# Patient Record
Sex: Female | Born: 1950 | Race: White | Hispanic: No | Marital: Single | Smoking: Current some day smoker
Health system: Southern US, Community
[De-identification: ages and names within clinical notes are randomized; demographics above are authoritative.]

## PROBLEM LIST (undated history)

## (undated) DIAGNOSIS — F32A Depression, unspecified: Secondary | ICD-10-CM

## (undated) DIAGNOSIS — M1990 Unspecified osteoarthritis, unspecified site: Secondary | ICD-10-CM

## (undated) DIAGNOSIS — F419 Anxiety disorder, unspecified: Secondary | ICD-10-CM

## (undated) DIAGNOSIS — J449 Chronic obstructive pulmonary disease, unspecified: Secondary | ICD-10-CM

## (undated) HISTORY — PX: HYSTEROSCOPY: SHX211

---

## 2004-02-05 ENCOUNTER — Emergency Department (HOSPITAL_COMMUNITY): Admission: EM | Admit: 2004-02-05 | Discharge: 2004-02-05 | Payer: Self-pay | Admitting: Emergency Medicine

## 2004-10-31 ENCOUNTER — Ambulatory Visit (HOSPITAL_COMMUNITY): Admission: RE | Admit: 2004-10-31 | Discharge: 2004-10-31 | Payer: Self-pay | Admitting: Family Medicine

## 2006-10-13 ENCOUNTER — Ambulatory Visit (HOSPITAL_COMMUNITY): Admission: RE | Admit: 2006-10-13 | Discharge: 2006-10-13 | Payer: Self-pay | Admitting: Family Medicine

## 2006-10-13 ENCOUNTER — Ambulatory Visit: Payer: Self-pay | Admitting: Orthopedic Surgery

## 2006-10-15 ENCOUNTER — Ambulatory Visit (HOSPITAL_COMMUNITY): Admission: RE | Admit: 2006-10-15 | Discharge: 2006-10-15 | Payer: Self-pay | Admitting: Orthopedic Surgery

## 2006-10-21 ENCOUNTER — Ambulatory Visit: Payer: Self-pay | Admitting: Orthopedic Surgery

## 2006-10-23 ENCOUNTER — Encounter (HOSPITAL_COMMUNITY): Admission: RE | Admit: 2006-10-23 | Discharge: 2006-11-22 | Payer: Self-pay | Admitting: Orthopedic Surgery

## 2006-11-25 ENCOUNTER — Encounter (HOSPITAL_COMMUNITY): Admission: RE | Admit: 2006-11-25 | Discharge: 2006-12-25 | Payer: Self-pay | Admitting: Orthopedic Surgery

## 2006-12-03 ENCOUNTER — Ambulatory Visit: Payer: Self-pay | Admitting: Orthopedic Surgery

## 2006-12-29 ENCOUNTER — Encounter (HOSPITAL_COMMUNITY): Admission: RE | Admit: 2006-12-29 | Discharge: 2007-01-28 | Payer: Self-pay | Admitting: Orthopedic Surgery

## 2006-12-31 ENCOUNTER — Ambulatory Visit: Payer: Self-pay | Admitting: Orthopedic Surgery

## 2010-02-13 ENCOUNTER — Ambulatory Visit (HOSPITAL_COMMUNITY): Admission: RE | Admit: 2010-02-13 | Discharge: 2010-02-13 | Payer: Self-pay | Admitting: Family Medicine

## 2010-04-06 ENCOUNTER — Encounter (HOSPITAL_COMMUNITY): Admission: RE | Admit: 2010-04-06 | Discharge: 2010-05-06 | Payer: Self-pay | Admitting: Preventative Medicine

## 2015-06-18 ENCOUNTER — Encounter (HOSPITAL_COMMUNITY): Payer: Self-pay

## 2015-06-18 ENCOUNTER — Inpatient Hospital Stay (HOSPITAL_COMMUNITY)
Admission: EM | Admit: 2015-06-18 | Discharge: 2015-06-23 | DRG: 190 | Disposition: A | Discharge status: Home / Self Care | Payer: 59 | Attending: Internal Medicine | Admitting: Internal Medicine

## 2015-06-18 ENCOUNTER — Emergency Department (HOSPITAL_COMMUNITY): Payer: 59

## 2015-06-18 DIAGNOSIS — T50905A Adverse effect of unspecified drugs, medicaments and biological substances, initial encounter: Secondary | ICD-10-CM

## 2015-06-18 DIAGNOSIS — F419 Anxiety disorder, unspecified: Secondary | ICD-10-CM | POA: Diagnosis present

## 2015-06-18 DIAGNOSIS — Z720 Tobacco use: Secondary | ICD-10-CM | POA: Diagnosis present

## 2015-06-18 DIAGNOSIS — F17210 Nicotine dependence, cigarettes, uncomplicated: Secondary | ICD-10-CM | POA: Diagnosis present

## 2015-06-18 DIAGNOSIS — M549 Dorsalgia, unspecified: Secondary | ICD-10-CM | POA: Diagnosis present

## 2015-06-18 DIAGNOSIS — J9601 Acute respiratory failure with hypoxia: Secondary | ICD-10-CM | POA: Diagnosis present

## 2015-06-18 DIAGNOSIS — M545 Low back pain: Secondary | ICD-10-CM | POA: Diagnosis present

## 2015-06-18 DIAGNOSIS — J441 Chronic obstructive pulmonary disease with (acute) exacerbation: Principal | ICD-10-CM | POA: Diagnosis present

## 2015-06-18 DIAGNOSIS — R946 Abnormal results of thyroid function studies: Secondary | ICD-10-CM | POA: Diagnosis present

## 2015-06-18 DIAGNOSIS — T380X5A Adverse effect of glucocorticoids and synthetic analogues, initial encounter: Secondary | ICD-10-CM | POA: Diagnosis present

## 2015-06-18 DIAGNOSIS — G8929 Other chronic pain: Secondary | ICD-10-CM | POA: Diagnosis present

## 2015-06-18 DIAGNOSIS — R739 Hyperglycemia, unspecified: Secondary | ICD-10-CM | POA: Diagnosis present

## 2015-06-18 DIAGNOSIS — R0602 Shortness of breath: Secondary | ICD-10-CM | POA: Diagnosis present

## 2015-06-18 LAB — CBC WITH DIFFERENTIAL/PLATELET
BASOS ABS: 0 10*3/uL (ref 0.0–0.1)
BASOS PCT: 0 %
EOS ABS: 0.1 10*3/uL (ref 0.0–0.7)
Eosinophils Relative: 1 %
HEMATOCRIT: 48.7 % — AB (ref 36.0–46.0)
Hemoglobin: 15.8 g/dL — ABNORMAL HIGH (ref 12.0–15.0)
Lymphocytes Relative: 43 %
Lymphs Abs: 4.1 10*3/uL — ABNORMAL HIGH (ref 0.7–4.0)
MCH: 31.9 pg (ref 26.0–34.0)
MCHC: 32.4 g/dL (ref 30.0–36.0)
MCV: 98.2 fL (ref 78.0–100.0)
MONO ABS: 0.7 10*3/uL (ref 0.1–1.0)
Monocytes Relative: 7 %
NEUTROS ABS: 4.7 10*3/uL (ref 1.7–7.7)
Neutrophils Relative %: 49 %
PLATELETS: 199 10*3/uL (ref 150–400)
RBC: 4.96 MIL/uL (ref 3.87–5.11)
RDW: 16 % — AB (ref 11.5–15.5)
WBC: 9.7 10*3/uL (ref 4.0–10.5)

## 2015-06-18 LAB — BASIC METABOLIC PANEL
Anion gap: 8 (ref 5–15)
BUN: 8 mg/dL (ref 6–20)
CO2: 32 mmol/L (ref 22–32)
Calcium: 9 mg/dL (ref 8.9–10.3)
Chloride: 102 mmol/L (ref 101–111)
Creatinine, Ser: 0.74 mg/dL (ref 0.44–1.00)
GFR calc Af Amer: >60 mL/min (ref >60)
GFR calc non Af Amer: >60 mL/min (ref >60)
Glucose, Bld: 92 mg/dL (ref 65–99)
Potassium: 3.9 mmol/L (ref 3.5–5.1)
Sodium: 142 mmol/L (ref 135–145)

## 2015-06-18 LAB — D-DIMER, QUANTITATIVE: D-Dimer, Quant: 0.5 ug/mL-FEU — ABNORMAL HIGH (ref 0.00–0.48)

## 2015-06-18 MED ORDER — SODIUM CHLORIDE 0.9 % IV BOLUS (SEPSIS): 1000.0000 mL | Freq: Once | INTRAVENOUS | Status: DC

## 2015-06-18 MED ORDER — IOHEXOL 350 MG/ML SOLN
100.0000 mL | Freq: Once | INTRAVENOUS | Status: AC | PRN
  Administered 2015-06-18: 100 mL via INTRAVENOUS

## 2015-06-18 MED ORDER — IPRATROPIUM-ALBUTEROL 0.5-2.5 (3) MG/3ML IN SOLN
3.0000 mL | Freq: Once | RESPIRATORY_TRACT | Status: AC
  Administered 2015-06-18: 3 mL via RESPIRATORY_TRACT
  Filled 2015-06-18: qty 3

## 2015-06-18 MED ORDER — PREDNISONE 50 MG PO TABS
60.0000 mg | ORAL_TABLET | Freq: Once | ORAL | Status: AC
  Administered 2015-06-18: 60 mg via ORAL
  Filled 2015-06-18 (×2): qty 1

## 2015-06-18 MED ORDER — AZITHROMYCIN 250 MG PO TABS
500.0000 mg | ORAL_TABLET | Freq: Once | ORAL | Status: AC
  Administered 2015-06-18: 500 mg via ORAL
  Filled 2015-06-18: qty 2

## 2015-06-18 MED ORDER — ALBUTEROL SULFATE HFA 108 (90 BASE) MCG/ACT IN AERS
INHALATION_SPRAY | RESPIRATORY_TRACT | Status: AC
  Administered 2015-06-18: 22:00:00
  Filled 2015-06-18: qty 6.7

## 2015-06-18 NOTE — ED Notes (Signed)
Patient ambulated oxygen level drop down to 78% after walking very short distant. Patient return to room back on O2.

## 2015-06-18 NOTE — ED Notes (Signed)
12 lead EKG completed and given to Dr. Vinnie Level

## 2015-06-18 NOTE — ED Notes (Signed)
Pt ambulated in hall without oxygen. O2 sats dropped to 73% and HR was 103.

## 2015-06-18 NOTE — Progress Notes (Signed)
First exacerbation of COPD from long term smoking, most likely not documented. Chronic cough.

## 2015-06-18 NOTE — ED Notes (Signed)
Patient had SOB times two days, progressively worsening. Cough with thick yellow sputum. O2 sats 78% on arrival, taken straight to ED room 17, 2l/[place] applied sats now 94%. Patient denies any respiratory history.

## 2015-06-18 NOTE — ED Provider Notes (Signed)
CSN: [id number]     Arrival date & time 06/18/15  1912 History   First MD Initiated Contact with Patient 06/18/15 1936     Chief Complaint  Patient presents with  . Shortness of Breath     (Consider location/radiation/quality/duration/timing/severity/associated sxs/prior Treatment) HPI   patient is a 64 year old female presenting with shortness of breath over the last 3-4 days. Patient was initially hypoxic after walking to the triage. At rest she stays at 93% on room air. Patient reports increased cough feelings of sinus congestion no fever. Patient's been taking over-the-counter medications with no help. Patient states after walking a short distance she becomes very short of breath. Patient's not on any estrogens has not had any long car trips or plane trips.  No past medical history on file. Past Surgical History  Procedure Laterality Date  . Hysteroscopy     No family history on file. Social History  Substance Use Topics  . Smoking status: Current Every Day Smoker -- 0.50 packs/day    Types: Cigarettes  . Smokeless tobacco: Not on file  . Alcohol Use: No   OB History    No data available     Review of Systems  Constitutional: Negative for fever, chills, activity change and fatigue.  HENT: Positive for congestion. Negative for drooling.   Eyes: Negative for discharge.  Respiratory: Positive for cough and shortness of breath. Negative for chest tightness.   Cardiovascular: Negative for chest pain, palpitations and leg swelling.  Gastrointestinal: Negative for abdominal distention.  Genitourinary: Negative for dysuria and difficulty urinating.  Musculoskeletal: Negative for joint swelling.  Skin: Negative for rash.  Allergic/Immunologic: Negative for immunocompromised state.  Psychiatric/Behavioral: Negative for agitation.      Allergies  Review of patient's allergies indicates not on file.  Home Medications   Prior to Admission medications   Not on File   BP  154/76 mmHg  Pulse 72  Resp 20  Ht 5' (1.524 m)  Wt 185 lb (83.915 kg)  BMI 36.13 kg/m2 Physical Exam  Constitutional: She is oriented to person, place, and time. She appears well-developed and well-nourished.  HENT:  Head: Normocephalic and atraumatic.  Eyes: Conjunctivae are normal. Right eye exhibits no discharge.  Neck: Neck supple.  Cardiovascular: Normal rate, regular rhythm and normal heart sounds.   No murmur heard. Pulmonary/Chest: She has wheezes. She has no rales.  tachypnic  Abdominal: Soft. She exhibits no distension. There is no tenderness.  Musculoskeletal: Normal range of motion. She exhibits no edema.  Neurological: She is oriented to person, place, and time. No cranial nerve deficit.  Skin: Skin is warm and dry. No rash noted. She is not diaphoretic.  Psychiatric: She has a normal mood and affect. Her behavior is normal.  Nursing note and vitals reviewed.   ED Course  Procedures (including critical care time) Labs Review Labs Reviewed  BASIC METABOLIC PANEL  CBC WITH DIFFERENTIAL/PLATELET    Imaging Review No results found. I have personally reviewed and evaluated these images and lab results as part of my medical decision-making.   EKG Interpretation None      MDM   Final diagnoses:  None    Patient is a very pleasant 64 year old female presenting today with shortness of breath and cough. Patient's had this for 3 days. Patient was hypoxic with ambulation. No fevers. Patient is a heavy smoker. Patient's had increased green sputum. Patient has an end expiratory wheezes. Will get chest x-ray for concern of pneumonia. Otherwise, would consider treatment  for bronchitis. Given the level of hypoxia will likely treat with antibiotics.   11:06 PM We gave patient prednisone and 3 do nebs. Patient still hypoxic to 82 on ambulation. Don't feel I can discharge patient home with such hypoxia. Despite the fact that she has no evidence of infection. I think this  is likely severe bronchitis. Given the level of hypoxia I ordered CT pulmonary embolism. Study was negative.  Courteney Randall An, MD 06/18/15 [phone]

## 2015-06-19 ENCOUNTER — Encounter (HOSPITAL_COMMUNITY): Payer: Self-pay | Admitting: Internal Medicine

## 2015-06-19 DIAGNOSIS — J441 Chronic obstructive pulmonary disease with (acute) exacerbation: Secondary | ICD-10-CM | POA: Insufficient documentation

## 2015-06-19 DIAGNOSIS — Z720 Tobacco use: Secondary | ICD-10-CM | POA: Diagnosis present

## 2015-06-19 DIAGNOSIS — F419 Anxiety disorder, unspecified: Secondary | ICD-10-CM | POA: Diagnosis present

## 2015-06-19 DIAGNOSIS — M549 Dorsalgia, unspecified: Secondary | ICD-10-CM | POA: Diagnosis present

## 2015-06-19 LAB — CBC
HCT: 47.5 % — ABNORMAL HIGH (ref 36.0–46.0)
HEMOGLOBIN: 15.1 g/dL — AB (ref 12.0–15.0)
MCH: 31.3 pg (ref 26.0–34.0)
MCHC: 31.8 g/dL (ref 30.0–36.0)
MCV: 98.5 fL (ref 78.0–100.0)
PLATELETS: 202 10*3/uL (ref 150–400)
RBC: 4.82 MIL/uL (ref 3.87–5.11)
RDW: 15.8 % — ABNORMAL HIGH (ref 11.5–15.5)
WBC: 7.9 10*3/uL (ref 4.0–10.5)

## 2015-06-19 LAB — BASIC METABOLIC PANEL
ANION GAP: 9 (ref 5–15)
BUN: 8 mg/dL (ref 6–20)
CALCIUM: 8.5 mg/dL — AB (ref 8.9–10.3)
CO2: 27 mmol/L (ref 22–32)
CREATININE: 0.82 mg/dL (ref 0.44–1.00)
Chloride: 99 mmol/L — ABNORMAL LOW (ref 101–111)
GLUCOSE: 223 mg/dL — AB (ref 65–99)
Potassium: 3.8 mmol/L (ref 3.5–5.1)
Sodium: 135 mmol/L (ref 135–145)

## 2015-06-19 LAB — TSH: TSH: 0.285 u[IU]/mL — AB (ref 0.350–4.500)

## 2015-06-19 MED ORDER — ALPRAZOLAM 1 MG PO TABS
1.0000 mg | ORAL_TABLET | Freq: Every day | ORAL | Status: DC
  Administered 2015-06-20 – 2015-06-22 (×3): 1 mg via ORAL
  Filled 2015-06-19 (×3): qty 1

## 2015-06-19 MED ORDER — ALBUTEROL SULFATE (2.5 MG/3ML) 0.083% IN NEBU: 2.5000 mg | INHALATION_SOLUTION | RESPIRATORY_TRACT | Status: DC | PRN

## 2015-06-19 MED ORDER — ENOXAPARIN SODIUM 40 MG/0.4ML ~~LOC~~ SOLN
40.0000 mg | SUBCUTANEOUS | Status: DC
  Administered 2015-06-19 – 2015-06-22 (×5): 40 mg via SUBCUTANEOUS
  Filled 2015-06-19 (×5): qty 0.4

## 2015-06-19 MED ORDER — LEVOFLOXACIN IN D5W 750 MG/150ML IV SOLN
750.0000 mg | INTRAVENOUS | Status: DC
Start: 2015-06-19 — End: 2015-06-22
  Administered 2015-06-19 – 2015-06-21 (×4): 750 mg via INTRAVENOUS
  Filled 2015-06-19 (×4): qty 150

## 2015-06-19 MED ORDER — ALBUTEROL SULFATE (2.5 MG/3ML) 0.083% IN NEBU
2.5000 mg | INHALATION_SOLUTION | Freq: Four times a day (QID) | RESPIRATORY_TRACT | Status: DC
Start: 2015-06-19 — End: 2015-06-19
  Administered 2015-06-19 (×3): 2.5 mg via RESPIRATORY_TRACT
  Filled 2015-06-19 (×3): qty 3

## 2015-06-19 MED ORDER — IPRATROPIUM-ALBUTEROL 0.5-2.5 (3) MG/3ML IN SOLN
3.0000 mL | Freq: Three times a day (TID) | RESPIRATORY_TRACT | Status: DC
  Administered 2015-06-20 – 2015-06-22 (×8): 3 mL via RESPIRATORY_TRACT
  Filled 2015-06-19 (×8): qty 3

## 2015-06-19 MED ORDER — FAMOTIDINE 20 MG PO TABS
20.0000 mg | ORAL_TABLET | Freq: Every day | ORAL | Status: DC
  Administered 2015-06-19 – 2015-06-22 (×4): 20 mg via ORAL
  Filled 2015-06-19 (×4): qty 1

## 2015-06-19 MED ORDER — NICOTINE 14 MG/24HR TD PT24
14.0000 mg | MEDICATED_PATCH | Freq: Every day | TRANSDERMAL | Status: DC
  Administered 2015-06-19 – 2015-06-23 (×5): 14 mg via TRANSDERMAL
  Filled 2015-06-19 (×5): qty 1

## 2015-06-19 MED ORDER — HYDROCODONE-ACETAMINOPHEN 10-325 MG PO TABS
1.0000 | ORAL_TABLET | ORAL | Status: DC | PRN
  Administered 2015-06-19 – 2015-06-23 (×13): 1 via ORAL
  Filled 2015-06-19 (×13): qty 1

## 2015-06-19 MED ORDER — ALUM & MAG HYDROXIDE-SIMETH 200-200-20 MG/5ML PO SUSP: 15.0000 mL | ORAL | Status: DC | PRN

## 2015-06-19 MED ORDER — IPRATROPIUM-ALBUTEROL 0.5-2.5 (3) MG/3ML IN SOLN
3.0000 mL | Freq: Four times a day (QID) | RESPIRATORY_TRACT | Status: DC
  Administered 2015-06-19: 3 mL via RESPIRATORY_TRACT
  Filled 2015-06-19: qty 3

## 2015-06-19 MED ORDER — METHYLPREDNISOLONE SODIUM SUCC 125 MG IJ SOLR
60.0000 mg | Freq: Four times a day (QID) | INTRAMUSCULAR | Status: DC
  Administered 2015-06-19 – 2015-06-21 (×10): 60 mg via INTRAVENOUS
  Filled 2015-06-19 (×10): qty 2

## 2015-06-19 MED ORDER — ALPRAZOLAM 1 MG PO TABS
1.0000 mg | ORAL_TABLET | Freq: Three times a day (TID) | ORAL | Status: DC
  Administered 2015-06-19 – 2015-06-23 (×14): 1 mg via ORAL
  Filled 2015-06-19 (×14): qty 1

## 2015-06-19 MED ORDER — IPRATROPIUM BROMIDE 0.02 % IN SOLN: 0.5000 mg | Freq: Four times a day (QID) | RESPIRATORY_TRACT | Status: DC

## 2015-06-19 NOTE — Care Management Note (Signed)
Case Management Note  Patient Details  Name: Rebecca Hansen MRN: [id number] Date of Birth: 05-28-1951  Subjective/Objective:                  Pt admitted from home with COPD exacerbation. Pt lives with her family and will return home at discharge. Pt is independent with ADL's.  Action/Plan: Will need home O2 assessment prior to discharge. Will continue to follow for discharge planning needs.  Expected Discharge Date:                  Expected Discharge Plan:  Home/Self Care  In-House Referral:  NA  Discharge planning Services  CM Consult  Post Acute Care Choice:  NA Choice offered to:  NA  DME Arranged:    DME Agency:     HH Arranged:    HH Agency:     Status of Service:  In process, will continue to follow  Medicare Important Message Given:    Date Medicare IM Given:    Medicare IM give by:    Date Additional Medicare IM Given:    Additional Medicare Important Message give by:     If discussed at Long Length of Stay Meetings, dates discussed:    Additional Comments:  Cheryl Flash, RN 06/19/2015, 1:53 PM

## 2015-06-19 NOTE — H&P (Signed)
[facility] History and Physical  WYLEE OGDEN ZOX:[id number] DOB: 1951/09/22    PCP:   Milana Obey, MD   Chief Complaint: Shortness of breath.   HPI: Rebecca Hansen is an 64 y.o. female with over 40 py tobacco use, but no Dx of COPD, hx of chronic back pain on narcotics, anxiety on benzodiazepines, HTN on HCTZ, but no known CAD or CHF, presented to the ER with increase SOB and wheezing.  She has had URI symptoms, and now has DOE.  In the ER she was found to have wheezing.  She was Tx with oral Prednisone and Nebs, but continue to desat to 70's with ambulation.  Work up included a CXR showing evidence of COPD, but no infiltrate.  A D-dimer was 0.5 culminated in a negative CTPA for PE, but showed evidence of COPD.  Hospitalist was asked to admit her for COPD exacerbation.  Her serology is unremarkable, but her bicarb was 30's and her Hb was 15.8 grams per dL, in keeping with evidence of chronic hypoxia and hypercapnea.  Hopsitalist was asked to admit her for COPD exacerbation.   Rewiew of Systems:  Constitutional: Negative for malaise, fever and chills. No significant weight loss or weight gain Eyes: Negative for eye pain, redness and discharge, diplopia, visual changes, or flashes of light. ENMT: Negative for ear pain, hoarseness, nasal congestion, sinus pressure and sore throat. No headaches; tinnitus, drooling, or problem swallowing. Cardiovascular: Negative for chest pain, palpitations, diaphoresis,  and peripheral edema. ; No orthopnea, PND Respiratory: Negative for cough, hemoptysis, wheezing and stridor. No pleuritic chestpain. Gastrointestinal: Negative for nausea, vomiting, diarrhea, constipation, abdominal pain, melena, blood in stool, hematemesis, jaundice and rectal bleeding.    Genitourinary: Negative for frequency, dysuria, incontinence,flank pain and hematuria; Musculoskeletal: Negative for back pain and neck pain. Negative for swelling and trauma.;  Skin: . Negative  for pruritus, rash, abrasions, bruising and skin lesion.; ulcerations Neuro: Negative for headache, lightheadedness and neck stiffness. Negative for weakness, altered level of consciousness , altered mental status, extremity weakness, burning feet, involuntary movement, seizure and syncope.  Psych: negative for anxiety, depression, insomnia, tearfulness, panic attacks, hallucinations, paranoia, suicidal or homicidal ideation    History reviewed. No pertinent past medical history.  Past Surgical History  Procedure Laterality Date  . Hysteroscopy      Medications:  HOME MEDS: Prior to Admission medications   Medication Sig Start Date End Date Taking? Authorizing Provider  ALPRAZolam Prudy Feeler) 1 MG tablet Take 1 mg by mouth 4 (four) times daily as needed. 06/06/15  Yes Historical Provider, MD  HYDROcodone-acetaminophen (NORCO) 10-325 MG per tablet Take 1 tablet by mouth every 8 (eight) hours as needed. 05/19/15  Yes Historical Provider, MD     Allergies:  No Known Allergies  Social History:   reports that she has been smoking Cigarettes.  She has been smoking about 0.50 packs per day. She does not have any smokeless tobacco history on file. She reports that she does not drink alcohol. Her drug history is not on file.  Family History: History reviewed. No pertinent family history.   Physical Exam: Filed Vitals:   06/18/15 2200 06/18/15 2214 06/18/15 2215 06/19/15 0015  BP: 125/73 142/67  134/75  Pulse: 74 80 80 75  Resp:  Height:      Weight:      SpO2: 95% 94% 94% 95%   Blood pressure 134/75, pulse 75, resp. rate 20, height 5' (1.524 m), weight 83.915 kg (  185 lb), SpO2 95 %.  GEN:  Pleasant  patient lying in the stretcher in no acute distress; cooperative with exam. PSYCH:  alert and oriented x4; does not appear anxious or depressed; affect is appropriate. HEENT: Mucous membranes pink and anicteric; PERRLA; EOM intact; no cervical lymphadenopathy nor thyromegaly or  carotid bruit; no JVD; There were no stridor. Neck is very supple. Breasts:: Not examined CHEST WALL: No tenderness CHEST: Normal respiration, wheezing diffusely with dense rhonchi.  HEART: Regular rate and rhythm.  There are no murmur, rub, or gallops.   BACK: No kyphosis or scoliosis; no CVA tenderness ABDOMEN: soft and non-tender; no masses, no organomegaly, normal abdominal bowel sounds; no pannus; no intertriginous candida. There is no rebound and no distention. Rectal Exam: Not done EXTREMITIES: No bone or joint deformity; age-appropriate arthropathy of the hands and knees; no edema; no ulcerations.  There is no calf tenderness. Genitalia: not examined PULSES: 2+ and symmetric SKIN: Normal hydration no rash or ulceration CNS: Cranial nerves 2-12 grossly intact no focal lateralizing neurologic deficit.  Speech is fluent; uvula elevated with phonation, facial symmetry and tongue midline. DTR are normal bilaterally, cerebella exam is intact, barbinski is negative and strengths are equaled bilaterally.  No sensory loss.   Labs on Admission:  Basic Metabolic Panel:  Recent Labs Lab 06/18/15 2047  NA 142  K 3.9  CL 102  CO2 32  GLUCOSE 92  BUN 8  CREATININE 0.74  CALCIUM 9.0   CBC:  Recent Labs Lab 06/18/15 2047  WBC 9.7  NEUTROABS 4.7  HGB 15.8*  HCT 48.7*  MCV 98.2  PLT 199   Radiological Exams on Admission: Dg Chest 2 View  06/18/2015   CLINICAL DATA:  Patient had SOB times two days, progressively worsening. Cough with thick yellow sputum. Current smoker 1/2 ppd  EXAM: CHEST  2 VIEW  COMPARISON:  None.  FINDINGS: Lungs are mildly hyperexpanded. There is mild interstitial thickening in the lung bases as well as chronic bronchitic change. No lung consolidation or edema. No pleural effusion or pneumothorax.  Cardiac silhouette is normal in size. No mediastinal or hilar masses or evidence of adenopathy.  Bony thorax is demineralized but grossly intact.  IMPRESSION: 1. No  acute cardiopulmonary disease. 2. Mild lung hyperexpansion. Mild interstitial thickening. Chronic medial lung base bronchitic change.   Electronically Signed   By: Amie Portland M.D.   On: 06/18/2015 20:24   Ct Angio Chest Pe W/cm &/or Wo Cm  06/18/2015   CLINICAL DATA:  Shortness of breath over last 3-4 days, initially hypoxic, 93% oxygenation on room air, increased cough, sinus congestion, no relief with over the counter medication, dyspnea on exertion, smoker  EXAM: CT ANGIOGRAPHY CHEST WITH CONTRAST  TECHNIQUE: Multidetector CT imaging of the chest was performed using the standard protocol during bolus administration of intravenous contrast. Multiplanar CT image reconstructions and MIPs were obtained to evaluate the vascular anatomy.  CONTRAST:  OMNIPAQUE IOHEXOL 350 MG/ML SOLN IV  COMPARISON:  None  FINDINGS: Scattered atherosclerotic calcifications aorta and minimally coronary arteries.  Aorta normal caliber without aneurysm or dissection.  Pulmonary arteries well opacified and patent.  No evidence of pulmonary embolism.  No thoracic adenopathy.  Visualized portion of upper abdomen normal appearance.  Emphysematous changes without infiltrate, pleural effusion or pneumothorax.  Subsegmental atelectasis at the medial RIGHT middle lobe and at the lingular base.  Bones appear demineralized.  Review of the MIP images confirms the above findings.  IMPRESSION: No evidence of  pulmonary embolism.  Emphysematous changes with bibasilar atelectasis anteriorly.   Electronically Signed   By: Ulyses Southward M.D.   On: 06/18/2015 23:40    EKG: Independently reviewed.    Assessment/Plan Present on Admission:  . COPD with acute exacerbation . Tobacco abuse . Back pain . Anxiety . COPD exacerbation  PLAN:  This patient likely has undiagnosed COPD, with chronic hypoxia, and will be admitted for COPD exacerbation.  Will give her nebs, IV Steroids, and an antibiotic.  I spoke with her about quitting cigarettes,  and she will try.  Will give a nicotine patch.  I will continue her home meds including her narcotics and benzo.  After resolution of her acute illness, trending oxygen saturation should be perform to determine if she would qualify for home oxygen.  She is stable, full code, and will be admitted to [facility] service.  Thank you and Good day.   Other plans as per orders.  Code Status: FULL Unk Lightning, MD. [facility] Pager [phone] 7pm to 7am.  06/19/2015, 12:31 AM

## 2015-06-19 NOTE — Progress Notes (Signed)
The patient is a 64 year old woman with tobacco abuse, chronic low back pain, chronic anxiety, and presumed COPD. She was admitted this morning by Dr. Conley Rolls for an apparent COPD with exacerbation. The patient was briefly seen and examined. Her chart, vital signs, and laboratory studies were reviewed. Agree with current management.  -We'll add sliding scale NovoLog for steroid-induced hyperglycemia. -We will add Atrovent nebulizer. -We'll order hemoglobin A1c and TSH. -The patient was encouraged to stop smoking.

## 2015-06-20 DIAGNOSIS — R946 Abnormal results of thyroid function studies: Secondary | ICD-10-CM | POA: Diagnosis present

## 2015-06-20 DIAGNOSIS — R739 Hyperglycemia, unspecified: Secondary | ICD-10-CM | POA: Diagnosis present

## 2015-06-20 DIAGNOSIS — T50905A Adverse effect of unspecified drugs, medicaments and biological substances, initial encounter: Secondary | ICD-10-CM

## 2015-06-20 LAB — BASIC METABOLIC PANEL
ANION GAP: 7 (ref 5–15)
BUN: 13 mg/dL (ref 6–20)
CHLORIDE: 101 mmol/L (ref 101–111)
CO2: 32 mmol/L (ref 22–32)
Calcium: 9.1 mg/dL (ref 8.9–10.3)
Creatinine, Ser: 0.71 mg/dL (ref 0.44–1.00)
GFR calc Af Amer: >60 mL/min (ref >60)
Glucose, Bld: 138 mg/dL — ABNORMAL HIGH (ref 65–99)
POTASSIUM: 4.5 mmol/L (ref 3.5–5.1)
SODIUM: 140 mmol/L (ref 135–145)

## 2015-06-20 LAB — CBC
HCT: 49.2 % — ABNORMAL HIGH (ref 36.0–46.0)
HEMOGLOBIN: 15.9 g/dL — AB (ref 12.0–15.0)
MCH: 31.9 pg (ref 26.0–34.0)
MCHC: 32.3 g/dL (ref 30.0–36.0)
MCV: 98.6 fL (ref 78.0–100.0)
PLATELETS: 236 10*3/uL (ref 150–400)
RBC: 4.99 MIL/uL (ref 3.87–5.11)
RDW: 16 % — ABNORMAL HIGH (ref 11.5–15.5)
WBC: 19.4 10*3/uL — AB (ref 4.0–10.5)

## 2015-06-20 LAB — GLUCOSE, CAPILLARY
GLUCOSE-CAPILLARY: 170 mg/dL — AB (ref 65–99)
GLUCOSE-CAPILLARY: 146 mg/dL — AB (ref 65–99)

## 2015-06-20 LAB — TSH: TSH: 0.164 u[IU]/mL — AB (ref 0.350–4.500)

## 2015-06-20 MED ORDER — INSULIN ASPART 100 UNIT/ML ~~LOC~~ SOLN
0.0000 [IU] | Freq: Three times a day (TID) | SUBCUTANEOUS | Status: DC
  Administered 2015-06-20: 4 [IU] via SUBCUTANEOUS
  Administered 2015-06-21: 3 [IU] via SUBCUTANEOUS
  Administered 2015-06-21 – 2015-06-22 (×2): 4 [IU] via SUBCUTANEOUS

## 2015-06-20 MED ORDER — DIPHENHYDRAMINE HCL 25 MG PO CAPS
50.0000 mg | ORAL_CAPSULE | Freq: Once | ORAL | Status: AC
Start: 2015-06-20 — End: 2015-06-20
  Administered 2015-06-20: 50 mg via ORAL
  Filled 2015-06-20: qty 2

## 2015-06-20 MED ORDER — INSULIN ASPART 100 UNIT/ML ~~LOC~~ SOLN: 0.0000 [IU] | Freq: Every day | SUBCUTANEOUS | Status: DC

## 2015-06-20 NOTE — Progress Notes (Signed)
[facility] PROGRESS NOTE  Rebecca Hansen ZOX:[id number] DOB: 01-Apr-1951 DOA: 06/18/2015 PCP: Milana Obey, MD    Code Status: Full code Family Communication: Discussed with daughter on 9/19 Disposition Plan: Discharge when clinically appropriate   Consultants:  None  Procedures:  None  Antibiotics:  Levaquin 9/19  HPI/Subjective: Patient has had some anxiousness, relieved with alprazolam. She is still short of breath with minimal activity.   Objective: Filed Vitals:   06/20/15 0630  BP: 134/59  Pulse: 71  Temp: 97.9 F (36.6 C)  Resp: 20   oxygen saturation 95% on supplemental oxygen.  Intake/Output Summary (Last 24 hours) at 06/20/15 1216 Last data filed at 06/20/15 0848  Gross per 24 hour  Intake    240 ml  Output   2350 ml  Net  -2110 ml   Filed Weights   06/18/15 1928 06/19/15 0059  Weight: 83.915 kg (185 lb) 82.3 kg (181 lb 7 oz)    Exam:   General:  Alert 64 year old woman laying in bed, in no acute distress.  Cardiovascular: S1, S2, no murmurs rubs or gallops.  Respiratory: Few rhonchi, scattered wheezes. Breathing nonlabored at rest.  Abdomen: Positive bowel sounds, soft, nontender, nondistended.  Musculoskeletal/extremities: No pedal edema.  Neurologic: She is alert and oriented 3. Cranial nerves II through XII are intact.  Psychiatric: Her speech is clear. She does not appear to be anxious.   Data Reviewed: Basic Metabolic Panel:  Recent Labs Lab 06/18/15 2047 06/19/15 0753 06/20/15 0620  NA 142 135 140  K 3.9 3.8 4.5  CL 102 99* 101  CO2 32 27 32  GLUCOSE 92 223* 138*  BUN CREATININE 0.74 0.82 0.71  CALCIUM 9.0 8.5* 9.1   Liver Function Tests: No results for input(s): AST, ALT, ALKPHOS, BILITOT, PROT, ALBUMIN in the last 168 hours. No results for input(s): LIPASE, AMYLASE in the last 168 hours. No results for input(s): AMMONIA in the last 168 hours. CBC:  Recent Labs Lab 06/18/15 2047  06/19/15 0753 06/20/15 0620  WBC 9.7 7.9 19.4*  NEUTROABS 4.7  --   --   HGB 15.8* 15.1* 15.9*  HCT 48.7* 47.5* 49.2*  MCV 98.2 98.5 98.6  PLT 199 202 236   Cardiac Enzymes: No results for input(s): CKTOTAL, CKMB, CKMBINDEX, TROPONINI in the last 168 hours. BNP (last 3 results) No results for input(s): BNP in the last 8760 hours.  ProBNP (last 3 results) No results for input(s): PROBNP in the last 8760 hours.  CBG: No results for input(s): GLUCAP in the last 168 hours.  No results found for this or any previous visit (from the past 240 hour(s)).   Studies: Dg Chest 2 View  06/18/2015   CLINICAL DATA:  Patient had SOB times two days, progressively worsening. Cough with thick yellow sputum. Current smoker 1/2 ppd  EXAM: CHEST  2 VIEW  COMPARISON:  None.  FINDINGS: Lungs are mildly hyperexpanded. There is mild interstitial thickening in the lung bases as well as chronic bronchitic change. No lung consolidation or edema. No pleural effusion or pneumothorax.  Cardiac silhouette is normal in size. No mediastinal or hilar masses or evidence of adenopathy.  Bony thorax is demineralized but grossly intact.  IMPRESSION: 1. No acute cardiopulmonary disease. 2. Mild lung hyperexpansion. Mild interstitial thickening. Chronic medial lung base bronchitic change.   Electronically Signed   By: Amie Portland M.D.   On: 06/18/2015 20:24   Ct Angio Chest Pe W/cm &/or Wo Cm  06/18/2015   CLINICAL DATA:  Shortness of breath over last 3-4 days, initially hypoxic, 93% oxygenation on room air, increased cough, sinus congestion, no relief with over the counter medication, dyspnea on exertion, smoker  EXAM: CT ANGIOGRAPHY CHEST WITH CONTRAST  TECHNIQUE: Multidetector CT imaging of the chest was performed using the standard protocol during bolus administration of intravenous contrast. Multiplanar CT image reconstructions and MIPs were obtained to evaluate the vascular anatomy.  CONTRAST:  OMNIPAQUE IOHEXOL 350  MG/ML SOLN IV  COMPARISON:  None  FINDINGS: Scattered atherosclerotic calcifications aorta and minimally coronary arteries.  Aorta normal caliber without aneurysm or dissection.  Pulmonary arteries well opacified and patent.  No evidence of pulmonary embolism.  No thoracic adenopathy.  Visualized portion of upper abdomen normal appearance.  Emphysematous changes without infiltrate, pleural effusion or pneumothorax.  Subsegmental atelectasis at the medial RIGHT middle lobe and at the lingular base.  Bones appear demineralized.  Review of the MIP images confirms the above findings.  IMPRESSION: No evidence of pulmonary embolism.  Emphysematous changes with bibasilar atelectasis anteriorly.   Electronically Signed   By: Ulyses Southward M.D.   On: 06/18/2015 23:40    Scheduled Meds: . ALPRAZolam  1 mg Oral TID  . ALPRAZolam  1 mg Oral QHS  . enoxaparin (LOVENOX) injection  40 mg Subcutaneous Q24H  . famotidine  20 mg Oral Daily  . ipratropium-albuterol  3 mL Nebulization TID  . levofloxacin (LEVAQUIN) IV  750 mg Intravenous Q24H  . methylPREDNISolone (SOLU-MEDROL) injection  60 mg Intravenous Q6H  . nicotine  14 mg Transdermal Daily   Continuous Infusions:   Assessment and plan:  Principal Problem:   COPD with acute exacerbation Active Problems:   Tobacco abuse   Back pain   Anxiety   COPD exacerbation   COPD exacerbation. The patient was started on Atrovent/albuterol nebulizer, IV Solu-Medrol, Levaquin, and oxygen titrated appropriately. These will be continued. The patient has had minimal subjective/clinical improvement.  Tobacco abuse. The patient was strongly advised to stop smoking. A nicotine patch was placed. Will continue.  Chronic anxiety. The patient was continued on scheduled Xanax.  Low TSH. Patient's TSH is low at 0.164. Will order free T4 and free T3 for further evaluation.  Hyperglycemia. This is likely steroid-induced. Will add sliding scale NovoLog. Hemoglobin A1c  ordered and pending.   Time spent: 35 minutes     [facility]  [facility] Pager [phone]. If 7PM-7AM, please contact night-coverage at [email or web address], password [facility] 06/20/2015, 12:16 PM  LOS: 2 days

## 2015-06-20 NOTE — Progress Notes (Signed)
Pt ambulated in hallway with standby assist from NT.  Pt tolerated well with no c/o pain or SOB, however, pt's O2 saturations on room air sitting started at 86%.  As patient began to ambulate on 2L, O2 saturations remained at 86%.  Pt's O2 was then increased to 3L while ambulating.  Pt's O2 saturation remained at 86%.  Pt's O2 level was again increased to 4L, and O2 saturations again remained at 86%.  Pt was assisted back to bed by NT.  Upon return to seated position, pt's O2 level was decreased to 2L and her O2 saturation recovered to 96%.

## 2015-06-20 NOTE — Progress Notes (Signed)
Pt was up and finishing her bath when I entered the room checked spo2 it was 87-88% on 2lpm cann..pt states she didn't feel SOB. Pt sat on bed and rested and o2 sats increased to 93% before breathing treatment spo2 with treatment 98%

## 2015-06-20 NOTE — Progress Notes (Signed)
Pt ambulated again in the hallway this afternoon.  This time patient was able to ambulated approximately 50 feet in hallway on room air before O2 saturations dropped to 86%.  Pt was then placed on 2L O2 via [place] and patient's O2 saturation increased to 96%.  Pt tolerated well with no complaints.

## 2015-06-21 DIAGNOSIS — J9601 Acute respiratory failure with hypoxia: Secondary | ICD-10-CM | POA: Diagnosis present

## 2015-06-21 LAB — CBC
HCT: 50 % — ABNORMAL HIGH (ref 36.0–46.0)
HEMOGLOBIN: 15.9 g/dL — AB (ref 12.0–15.0)
MCH: 31.5 pg (ref 26.0–34.0)
MCHC: 31.8 g/dL (ref 30.0–36.0)
MCV: 99.2 fL (ref 78.0–100.0)
PLATELETS: 253 10*3/uL (ref 150–400)
RBC: 5.04 MIL/uL (ref 3.87–5.11)
RDW: 16.1 % — ABNORMAL HIGH (ref 11.5–15.5)
WBC: 18.8 10*3/uL — ABNORMAL HIGH (ref 4.0–10.5)

## 2015-06-21 LAB — BASIC METABOLIC PANEL
Anion gap: 5 (ref 5–15)
BUN: 18 mg/dL (ref 6–20)
CHLORIDE: 100 mmol/L — AB (ref 101–111)
CO2: 34 mmol/L — ABNORMAL HIGH (ref 22–32)
CREATININE: 0.68 mg/dL (ref 0.44–1.00)
Calcium: 8.9 mg/dL (ref 8.9–10.3)
GFR calc Af Amer: >60 mL/min (ref >60)
GFR calc non Af Amer: >60 mL/min (ref >60)
Glucose, Bld: 131 mg/dL — ABNORMAL HIGH (ref 65–99)
Potassium: 4.2 mmol/L (ref 3.5–5.1)
SODIUM: 139 mmol/L (ref 135–145)

## 2015-06-21 LAB — HEMOGLOBIN A1C
HEMOGLOBIN A1C: 5.7 % — AB (ref 4.8–5.6)
Mean Plasma Glucose: 117 mg/dL

## 2015-06-21 LAB — GLUCOSE, CAPILLARY
GLUCOSE-CAPILLARY: 153 mg/dL — AB (ref 65–99)
Glucose-Capillary: 117 mg/dL — ABNORMAL HIGH (ref 65–99)
Glucose-Capillary: 140 mg/dL — ABNORMAL HIGH (ref 65–99)
Glucose-Capillary: 142 mg/dL — ABNORMAL HIGH (ref 65–99)

## 2015-06-21 LAB — T4, FREE: Free T4: 0.74 ng/dL (ref 0.61–1.12)

## 2015-06-21 MED ORDER — METHYLPREDNISOLONE SODIUM SUCC 40 MG IJ SOLR
40.0000 mg | Freq: Four times a day (QID) | INTRAMUSCULAR | Status: DC
  Administered 2015-06-21 – 2015-06-22 (×4): 40 mg via INTRAVENOUS
  Filled 2015-06-21 (×4): qty 1

## 2015-06-21 MED ORDER — MAGIC MOUTHWASH
15.0000 mL | ORAL | Status: DC
  Administered 2015-06-21 – 2015-06-22 (×3): 15 mL via ORAL
  Filled 2015-06-21 (×3): qty 15

## 2015-06-21 NOTE — Progress Notes (Signed)
O2 sats assessment:  Resting O2 sats on RA = 96%; Ambulating O2 sats on RA = 85%; Ambulating O2 sats on 2L O2 = 88%; Resting O2 sats on 2L O2 = 97%.  Pt placed back in bed and left on 2L O2.

## 2015-06-21 NOTE — Progress Notes (Signed)
Patient requesting to shower.  Received verbal order that patient may come off of heart monitor to shower.

## 2015-06-21 NOTE — Progress Notes (Signed)
[facility] PROGRESS NOTE  Rebecca Hansen ZOX:[id number] DOB: 05-20-1951 DOA: 06/18/2015 PCP: Milana Obey, MD    Code Status: Full code Family Communication: Discussed with daughter  , possibly in the next 24-48 hours.Disposition Plan: Discharge when clinically appropriate   Consultants:  None  Procedures:  None  Antibiotics:  Levaquin 9/19  HPI/Subjective: Patient says she is breathing better. She has let shortness of breath with activity. Nursing reports that the patient oxygen saturations decreased to the mid 80s on room air at rest and with oxygenation, decreases to 88% with ambulation.  Objective: Filed Vitals:   06/21/15 0624  BP: 135/94  Pulse: 70  Temp: 97.5 F (36.4 C)  Resp: 19   oxygen saturation 92% on supplemental oxygen.  Intake/Output Summary (Last 24 hours) at 06/21/15 1230 Last data filed at 06/21/15 0851  Gross per 24 hour  Intake    600 ml  Output   1950 ml  Net  -1350 ml   Filed Weights   06/18/15 1928 06/19/15 0059  Weight: 83.915 kg (185 lb) 82.3 kg (181 lb 7 oz)    Exam:   General:  Alert 64 year old woman laying in bed, in no acute distress.  Cardiovascular: S1, S2, no murmurs rubs or gallops.  Respiratory: Few  scattered wheezes. Breathing nonlabored at rest.  Abdomen: Positive bowel sounds, soft, nontender, nondistended.  Musculoskeletal/extremities: No pedal edema.  Neurologic: She is alert and oriented 3. Cranial nerves II through XII are intact.  Psychiatric: Her speech is clear. She does not appear to be anxious.   Data Reviewed: Basic Metabolic Panel:  Recent Labs Lab 06/18/15 2047 06/19/15 0753 06/20/15 0620 06/21/15 0604  NA 142 135 140 139  K 3.9 3.8 4.5 4.2  CL 102 99* 101 100*  CO2 32 27 32 34*  GLUCOSE 92 223* 138* 131*  BUN CREATININE 0.74 0.82 0.71 0.68  CALCIUM 9.0 8.5* 9.1 8.9   Liver Function Tests: No results for input(s): AST, ALT, ALKPHOS, BILITOT, PROT, ALBUMIN in  the last 168 hours. No results for input(s): LIPASE, AMYLASE in the last 168 hours. No results for input(s): AMMONIA in the last 168 hours. CBC:  Recent Labs Lab 06/18/15 2047 06/19/15 0753 06/20/15 0620 06/21/15 0604  WBC 9.7 7.9 19.4* 18.8*  NEUTROABS 4.7  --   --   --   HGB 15.8* 15.1* 15.9* 15.9*  HCT 48.7* 47.5* 49.2* 50.0*  MCV 98.2 98.5 98.6 99.2  PLT 199 202 236 253   Cardiac Enzymes: No results for input(s): CKTOTAL, CKMB, CKMBINDEX, TROPONINI in the last 168 hours. BNP (last 3 results) No results for input(s): BNP in the last 8760 hours.  ProBNP (last 3 results) No results for input(s): PROBNP in the last 8760 hours.  CBG:  Recent Labs Lab 06/20/15 1614 06/20/15 2122 06/21/15 0727 06/21/15 1137  GLUCAP 170* 146* 153* 117*    No results found for this or any previous visit (from the past 240 hour(s)).   Studies: No results found.  Scheduled Meds: . ALPRAZolam  1 mg Oral TID  . ALPRAZolam  1 mg Oral QHS  . enoxaparin (LOVENOX) injection  40 mg Subcutaneous Q24H  . famotidine  20 mg Oral Daily  . insulin aspart  0-20 Units Subcutaneous TID WC  . insulin aspart  0-5 Units Subcutaneous QHS  . ipratropium-albuterol  3 mL Nebulization TID  . levofloxacin (LEVAQUIN) IV  750 mg Intravenous Q24H  . methylPREDNISolone (SOLU-MEDROL) injection  60 mg  Intravenous Q6H  . nicotine  14 mg Transdermal Daily   Continuous Infusions:   Assessment and plan:  Principal Problem:   COPD with acute exacerbation Active Problems:   Acute respiratory failure with hypoxia   Tobacco abuse   Back pain   Anxiety   Hyperglycemia, drug-induced   Abnormal thyroid function test   COPD exacerbation, causing acute respiratory failure with hypoxia. The patient is improving, but her oxygen saturation on room air, per recent check, ranges in the mid 80s. With ambulation on 2 L nasal cannula oxygen, it improves to 88%. At rest, with 2 L nasal cannula oxygen, it improves to the  mid 90s. The patient was started on Atrovent/albuterol nebulizer, IV Solu-Medrol, Levaquin, and oxygen titrated appropriately. These will be continued. The patient has had minimal subjective/clinical improvement. -We'll decrease Solu-Medrol. -We'll continue to evaluate her for need for home oxygen.  Tobacco abuse. The patient was strongly advised to stop smoking. A nicotine patch was placed. Will continue.  Chronic anxiety. The patient was continued on scheduled Xanax.  Low TSH. Patient's TSH is low at 0.164. Free T4 and free T3 pending.  Hyperglycemia. This is likely steroid-induced. Sliding scale NovoLog was started. Her CBGs are reasonable. Hemoglobin A1c ordered and was within normal limits at 5.7.   Time spent: 30 minutes     [facility]  [facility] Pager [phone]. If 7PM-7AM, please contact night-coverage at [email or web address], password [facility] 06/21/2015, 12:30 PM  LOS: 3 days

## 2015-06-21 NOTE — Progress Notes (Signed)
Pt ambulated in hallway. Oxygenation was 92% walking in the halls. Pt approximately walked 150 ft. Pt tolerated ambulating well.

## 2015-06-22 LAB — GLUCOSE, CAPILLARY
Glucose-Capillary: 156 mg/dL — ABNORMAL HIGH (ref 65–99)
Glucose-Capillary: 122 mg/dL — ABNORMAL HIGH (ref 65–99)
Glucose-Capillary: 135 mg/dL — ABNORMAL HIGH (ref 65–99)

## 2015-06-22 LAB — BASIC METABOLIC PANEL
ANION GAP: 5 (ref 5–15)
BUN: 18 mg/dL (ref 6–20)
CALCIUM: 8.7 mg/dL — AB (ref 8.9–10.3)
CO2: 34 mmol/L — AB (ref 22–32)
Chloride: 99 mmol/L — ABNORMAL LOW (ref 101–111)
Creatinine, Ser: 0.65 mg/dL (ref 0.44–1.00)
GFR calc non Af Amer: >60 mL/min (ref >60)
Glucose, Bld: 146 mg/dL — ABNORMAL HIGH (ref 65–99)
POTASSIUM: 3.8 mmol/L (ref 3.5–5.1)
Sodium: 138 mmol/L (ref 135–145)

## 2015-06-22 LAB — T3, FREE: T3, Free: 1.7 pg/mL — ABNORMAL LOW (ref 2.0–4.4)

## 2015-06-22 MED ORDER — LEVOFLOXACIN 750 MG PO TABS
750.0000 mg | ORAL_TABLET | Freq: Every day | ORAL | Status: DC
  Administered 2015-06-22 – 2015-06-23 (×2): 750 mg via ORAL
  Filled 2015-06-22 (×2): qty 1

## 2015-06-22 MED ORDER — MAGIC MOUTHWASH
5.0000 mL | Freq: Three times a day (TID) | ORAL | Status: DC
  Administered 2015-06-22 – 2015-06-23 (×3): 5 mL via ORAL
  Filled 2015-06-22 (×2): qty 5

## 2015-06-22 MED ORDER — METHYLPREDNISOLONE SODIUM SUCC 40 MG IJ SOLR: 40.0000 mg | Freq: Two times a day (BID) | INTRAMUSCULAR | Status: DC

## 2015-06-22 MED ORDER — SODIUM CHLORIDE 0.9 % IV SOLN: Freq: Once | INTRAVENOUS | Status: DC

## 2015-06-22 MED ORDER — NYSTATIN 100000 UNIT/ML MT SUSP
5.0000 mL | Freq: Three times a day (TID) | OROMUCOSAL | Status: DC
  Administered 2015-06-22 – 2015-06-23 (×4): 500000 [IU] via ORAL
  Filled 2015-06-22 (×4): qty 5

## 2015-06-22 MED ORDER — PREDNISONE 20 MG PO TABS
50.0000 mg | ORAL_TABLET | Freq: Two times a day (BID) | ORAL | Status: DC
  Administered 2015-06-22 – 2015-06-23 (×2): 50 mg via ORAL
  Filled 2015-06-22 (×2): qty 2
  Filled 2015-06-22 (×2): qty 1

## 2015-06-22 MED ORDER — INSULIN ASPART 100 UNIT/ML ~~LOC~~ SOLN
0.0000 [IU] | Freq: Every day | SUBCUTANEOUS | Status: DC
Start: 2015-06-22 — End: 2015-06-23

## 2015-06-22 MED ORDER — INSULIN ASPART 100 UNIT/ML ~~LOC~~ SOLN
0.0000 [IU] | Freq: Three times a day (TID) | SUBCUTANEOUS | Status: DC
  Administered 2015-06-22 (×2): 2 [IU] via SUBCUTANEOUS

## 2015-06-22 MED ORDER — IPRATROPIUM-ALBUTEROL 0.5-2.5 (3) MG/3ML IN SOLN: 3.0000 mL | Freq: Four times a day (QID) | RESPIRATORY_TRACT | Status: DC | PRN

## 2015-06-22 NOTE — Progress Notes (Signed)
Pt has loss of IV access. MD notified and made aware.  

## 2015-06-22 NOTE — Progress Notes (Signed)
[facility] PROGRESS NOTE  DOLORES EWING ZOX:[id number] DOB: [birth year] DOA: 06/18/2015 PCP: Milana Obey, MD    Code Status: Full code Family Communication: Discussed with daughter  Discharge disposition: Probable discharge in the next 24 hours.   Consultants:  None  Procedures:  None  Antibiotics:  Levaquin 9/19>>  HPI/Subjective: Patient complains of a sore mouth and little white dots on her buccal mucosa. Magic mouthwash was started yesterday with little improvement. Patient is not sure if the Mrs Sharilyn Sites seasoning caused it or something else. She feels that she is breathing better, but feels like she needs another day in the hospital.  Objective: Filed Vitals:   06/22/15 0609  BP: 133/79  Pulse: 68  Temp: 98 F (36.7 C)  Resp: 20   oxygen saturation 99% on supplemental oxygen. Reportedly 92% on room air.  Intake/Output Summary (Last 24 hours) at 06/22/15 1055 Last data filed at 06/22/15 0800  Gross per 24 hour  Intake   1320 ml  Output   1400 ml  Net    -80 ml   Filed Weights   06/18/15 1928 06/19/15 0059  Weight: 83.915 kg (185 lb) 82.3 kg (181 lb 7 oz)    Exam:   General:  Alert 64 year old woman laying in bed, in no acute distress.  Oropharynx: Moist mucous membranes. No obvious white exudates, but small white pimply areas scattered on her buccal mucosa and not necessarily on the tongue or lips.  Cardiovascular: S1, S2, no murmurs rubs or gallops.  Respiratory: Few  scattered wheezes; decreased from yesterday. No rhonchi. Breathing nonlabored at rest.  Abdomen: Positive bowel sounds, soft, nontender, nondistended.  Musculoskeletal/extremities: No pedal edema.  Neurologic: She is alert and oriented 3. Cranial nerves II through XII are intact.  Psychiatric: Her speech is clear. She is cooperative.   Data Reviewed: Basic Metabolic Panel:  Recent Labs Lab 06/18/15 2047 06/19/15 0753 06/20/15 0620 06/21/15 0604 06/22/15 0623   NA 142 135 140 139 138  K 3.9 3.8 4.5 4.2 3.8  CL 102 99* 101 100* 99*  CO2 32 27 32 34* 34*  GLUCOSE 92 223* 138* 131* 146*  BUN CREATININE 0.74 0.82 0.71 0.68 0.65  CALCIUM 9.0 8.5* 9.1 8.9 8.7*   Liver Function Tests: No results for input(s): AST, ALT, ALKPHOS, BILITOT, PROT, ALBUMIN in the last 168 hours. No results for input(s): LIPASE, AMYLASE in the last 168 hours. No results for input(s): AMMONIA in the last 168 hours. CBC:  Recent Labs Lab 06/18/15 2047 06/19/15 0753 06/20/15 0620 06/21/15 0604  WBC 9.7 7.9 19.4* 18.8*  NEUTROABS 4.7  --   --   --   HGB 15.8* 15.1* 15.9* 15.9*  HCT 48.7* 47.5* 49.2* 50.0*  MCV 98.2 98.5 98.6 99.2  PLT 199 202 236 253   Cardiac Enzymes: No results for input(s): CKTOTAL, CKMB, CKMBINDEX, TROPONINI in the last 168 hours. BNP (last 3 results) No results for input(s): BNP in the last 8760 hours.  ProBNP (last 3 results) No results for input(s): PROBNP in the last 8760 hours.  CBG:  Recent Labs Lab 06/21/15 0727 06/21/15 1137 06/21/15 1639 06/21/15 2054 06/22/15 0706  GLUCAP 153* 117* 140* 142* 156*    No results found for this or any previous visit (from the past 240 hour(s)).   Studies: No results found.  Scheduled Meds: . ALPRAZolam  1 mg Oral TID  . ALPRAZolam  1 mg Oral QHS  . enoxaparin (LOVENOX) injection  40 mg Subcutaneous Q24H  . famotidine  20 mg Oral Daily  . insulin aspart  0-20 Units Subcutaneous TID WC  . insulin aspart  0-5 Units Subcutaneous QHS  . ipratropium-albuterol  3 mL Nebulization TID  . levofloxacin (LEVAQUIN) IV  750 mg Intravenous Q24H  . magic mouthwash  5 mL Oral TID  . methylPREDNISolone (SOLU-MEDROL) injection  40 mg Intravenous Q6H  . nicotine  14 mg Transdermal Daily  . nystatin  5 mL Oral TID   Continuous Infusions:   Assessment and plan:  Principal Problem:   COPD with acute exacerbation Active Problems:   Acute respiratory failure with hypoxia   Tobacco  abuse   Back pain   Anxiety   Hyperglycemia, drug-induced   Abnormal thyroid function test   COPD exacerbation, causing acute respiratory failure with hypoxia. The patient is improving, but her oxygen saturation on room air, per recent check, ranges in the mid 80s. With ambulation on 2 L nasal cannula oxygen, it improves to 88%. At rest, with 2 L nasal cannula oxygen, it improves to the mid 90s. The patient was started on Atrovent/albuterol nebulizer, IV Solu-Medrol, Levaquin, and oxygen titrated appropriately. -She has improved clinically and symptomatically, but is still slightly symptomatic. Her oxygen saturations are improving on room air. We'll continue to monitor her oxygen saturations for need for home oxygen. -We will treat her for another 24 hours. Will titrate down her Solu-Medrol. Will change Levaquin to by mouth.  Tobacco abuse. The patient was strongly advised to stop smoking. A nicotine patch was placed. Will continue.  Chronic anxiety. The patient was continued on scheduled Xanax.  Low TSH. Patient's TSH is low at 0.164. Free T4 is within normal limits. Her free T3 is slightly low. -Query sick euthyroid syndrome. Would recommend follow-up TSH/free T4/free T3 in the outpatient setting in the next 6-8 weeks.  Hyperglycemia. This is likely steroid-induced. Sliding scale NovoLog was started. Her CBGs have been reasonable. Hemoglobin A1c ordered and was within normal limits at 5.7.  Mouth lesions. Not sure if these lesions are secondary to the nebs or Mrs.Dash seasoning or superimposed yeast infection. -She was started on Magic mouthwash. We'll add additional nystatin swish and swallow. -Patient was instructed to avoid using Mrs.Dash seasoning.  Leukocytosis. This is likely secondary to IV Solu-Medrol. She is in has been afebrile.   Time spent: 30 minutes     [facility]  [facility] Pager [phone]. If 7PM-7AM, please contact night-coverage at  [email or web address], password [facility] 06/22/2015, 10:55 AM  LOS: 4 days

## 2015-06-22 NOTE — Progress Notes (Signed)
SATURATION QUALIFICATIONS: (This note is used to comply with regulatory documentation for home oxygen)  Patient Saturations on Room Air at Rest = 95%  Patient Saturations on Room Air while Ambulating = 86%  Patient Saturations on 2 Liters of oxygen while Ambulating = 93%   

## 2015-06-23 DIAGNOSIS — J9601 Acute respiratory failure with hypoxia: Secondary | ICD-10-CM

## 2015-06-23 DIAGNOSIS — J441 Chronic obstructive pulmonary disease with (acute) exacerbation: Principal | ICD-10-CM

## 2015-06-23 LAB — GLUCOSE, CAPILLARY
GLUCOSE-CAPILLARY: 86 mg/dL (ref 65–99)
Glucose-Capillary: 143 mg/dL — ABNORMAL HIGH (ref 65–99)
Glucose-Capillary: 98 mg/dL (ref 65–99)

## 2015-06-23 LAB — CBC
HEMATOCRIT: 48.7 % — AB (ref 36.0–46.0)
Hemoglobin: 15.8 g/dL — ABNORMAL HIGH (ref 12.0–15.0)
MCH: 31.1 pg (ref 26.0–34.0)
MCHC: 32.4 g/dL (ref 30.0–36.0)
MCV: 95.9 fL (ref 78.0–100.0)
Platelets: 228 10*3/uL (ref 150–400)
RBC: 5.08 MIL/uL (ref 3.87–5.11)
RDW: 15.1 % (ref 11.5–15.5)
WBC: 12.4 10*3/uL — ABNORMAL HIGH (ref 4.0–10.5)

## 2015-06-23 MED ORDER — ALBUTEROL SULFATE (2.5 MG/3ML) 0.083% IN NEBU: 2.5000 mg | INHALATION_SOLUTION | RESPIRATORY_TRACT | Status: DC | PRN

## 2015-06-23 MED ORDER — PREDNISONE 10 MG PO TABS: 10.0000 mg | ORAL_TABLET | Freq: Every day | ORAL | Status: DC

## 2015-06-23 MED ORDER — BUDESONIDE-FORMOTEROL FUMARATE 80-4.5 MCG/ACT IN AERO: 2.0000 | INHALATION_SPRAY | Freq: Two times a day (BID) | RESPIRATORY_TRACT | Status: DC

## 2015-06-23 MED ORDER — TIOTROPIUM BROMIDE MONOHYDRATE 18 MCG IN CAPS: 18.0000 ug | ORAL_CAPSULE | Freq: Every day | RESPIRATORY_TRACT | Status: AC

## 2015-06-23 MED ORDER — ALBUTEROL SULFATE HFA 108 (90 BASE) MCG/ACT IN AERS: 2.0000 | INHALATION_SPRAY | RESPIRATORY_TRACT | Status: AC | PRN

## 2015-06-23 MED ORDER — LEVOFLOXACIN 750 MG PO TABS: 750.0000 mg | ORAL_TABLET | Freq: Every day | ORAL | Status: DC

## 2015-06-23 NOTE — Progress Notes (Signed)
SATURATION QUALIFICATIONS: (This note is used to comply with regulatory documentation for home oxygen)  Patient Saturations on Room Air at Rest = 97%  Patient Saturations on Room Air while Ambulating = 86%  Patient Saturations on 2 Liters of oxygen while Ambulating = 96%  Please briefly explain why patient needs home oxygen:

## 2015-06-23 NOTE — Care Management Note (Signed)
Case Management Note  Patient Details  Name: FRANCENE MCERLEAN MRN: [id number] Date of Birth: [birth year]  Subjective/Objective:                    Action/Plan:   Expected Discharge Date:                  Expected Discharge Plan:  Home/Self Care  In-House Referral:  NA  Discharge planning Services  CM Consult  Post Acute Care Choice:  NA Choice offered to:  NA  DME Arranged:  Nebulizer machine, Oxygen DME Agency:  [facility]  HH Arranged:    HH Agency:     Status of Service:  Completed, signed off  Medicare Important Message Given:    Date Medicare IM Given:    Medicare IM give by:    Date Additional Medicare IM Given:    Additional Medicare Important Message give by:     If discussed at Long Length of Stay Meetings, dates discussed:    Additional Comments: Pt discharged home today with home O2 and neb machine from [place]. Portable O2 delivered to pts room prior to discharge. Neb machine and concentrator will be delivered to pts home after discharge. Pt and pts nurse aware of discharge arrangements. Arlyss [place], RN 06/23/2015, 1:15 PM

## 2015-06-23 NOTE — Discharge Summary (Signed)
Physician Discharge Summary  [name] ZOX:[id number] DOB: [birth year] DOA: 06/18/2015  PCP: Rebecca Obey, MD  Admit date: 06/18/2015 Discharge date: 06/23/2015  Time spent: 45 minutes  Recommendations for Outpatient Follow-up:  -Will be discharged home today. -Advised to follow up with PCP in 2-3 weeks for continued home oxygen necessity. -Will need PFTs once acute episode resolved to formally diagnose COPD.   Discharge Diagnoses:  Principal Problem:   COPD with acute exacerbation Active Problems:   Tobacco abuse   Back pain   Anxiety   Hyperglycemia, drug-induced   Abnormal thyroid function test   Acute respiratory failure with hypoxia   Discharge Condition: Stable and improved  Filed Weights   06/18/15 1928 06/19/15 0059  Weight: 83.915 kg (185 lb) 82.3 kg (181 lb 7 oz)    History of present illness:  Rebecca Hansen is an 64 y.o. female with over 40 py tobacco use, but no Dx of COPD, hx of chronic back pain on narcotics, anxiety on benzodiazepines, HTN on HCTZ, but no known CAD or CHF, presented to the ER with increase SOB and wheezing. She has had URI symptoms, and now has DOE. In the ER she was found to have wheezing. She was Tx with oral Prednisone and Nebs, but continue to desat to 70's with ambulation. Work up included a CXR showing evidence of COPD, but no infiltrate. A D-dimer was 0.5 culminated in a negative CTPA for PE, but showed evidence of COPD. Hospitalist was asked to admit her for COPD exacerbation. Her serology is unremarkable, but her bicarb was 30's and her Hb was 15.8 grams per dL, in keeping with evidence of chronic hypoxia and hypercapnea. Hopsitalist was asked to admit her for COPD exacerbation.    Hospital Course:   Acute Respiratory Failure -Secondary to COPD. -See below for details. -Will need oxygen on DC.  COPD with Acute Exacerbation -Improved. -Will start on spiriva/symbicort with rescue albuterol. -Will need PFTs  once this acute episode resolved for formal diagnosis. -Prednisone taper and levaquin for 5 days).  Tobacco Abuse -Counseled on cessation.  Procedures:  None   Consultations:  None  Discharge Instructions     Medication List    TAKE these medications        albuterol 108 (90 BASE) MCG/ACT inhaler  Commonly known as:  PROVENTIL HFA;VENTOLIN HFA  Inhale 2 puffs into the lungs every 4 (four) hours as needed for wheezing or shortness of breath.     albuterol (2.5 MG/3ML) 0.083% nebulizer solution  Commonly known as:  PROVENTIL  Take 3 mLs (2.5 mg total) by nebulization every 2 (two) hours as needed for wheezing or shortness of breath.     ALPRAZolam 1 MG tablet  Commonly known as:  XANAX  Take 1 mg by mouth 4 (four) times daily as needed.     budesonide-formoterol 80-4.5 MCG/ACT inhaler  Commonly known as:  SYMBICORT  Inhale 2 puffs into the lungs 2 (two) times daily.     HYDROcodone-acetaminophen 10-325 MG per tablet  Commonly known as:  NORCO  Take 1 tablet by mouth every 8 (eight) hours as needed.     levofloxacin 750 MG tablet  Commonly known as:  LEVAQUIN  Take 1 tablet (750 mg total) by mouth daily.     predniSONE 10 MG tablet  Commonly known as:  DELTASONE  Take 1 tablet (10 mg total) by mouth daily with breakfast. Take 6 tablets today and then decrease by 1 tablet daily until  none are left.     tiotropium 18 MCG inhalation capsule  Commonly known as:  SPIRIVA HANDIHALER  Place 1 capsule (18 mcg total) into inhaler and inhale daily.       No Known Allergies    The results of significant diagnostics from this hospitalization (including imaging, microbiology, ancillary and laboratory) are listed below for reference.    Significant Diagnostic Studies: Dg Chest 2 View  06/18/2015   CLINICAL DATA:  Patient had SOB times two days, progressively worsening. Cough with thick yellow sputum. Current smoker 1/2 ppd  EXAM: CHEST  2 VIEW  COMPARISON:  None.   FINDINGS: Lungs are mildly hyperexpanded. There is mild interstitial thickening in the lung bases as well as chronic bronchitic change. No lung consolidation or edema. No pleural effusion or pneumothorax.  Cardiac silhouette is normal in size. No mediastinal or hilar masses or evidence of adenopathy.  Bony thorax is demineralized but grossly intact.  IMPRESSION: 1. No acute cardiopulmonary disease. 2. Mild lung hyperexpansion. Mild interstitial thickening. Chronic medial lung base bronchitic change.   Electronically Signed   By: Amie Portland M.D.   On: 06/18/2015 20:24   Ct Angio Chest Pe W/cm &/or Wo Cm  06/18/2015   CLINICAL DATA:  Shortness of breath over last 3-4 days, initially hypoxic, 93% oxygenation on room air, increased cough, sinus congestion, no relief with over the counter medication, dyspnea on exertion, smoker  EXAM: CT ANGIOGRAPHY CHEST WITH CONTRAST  TECHNIQUE: Multidetector CT imaging of the chest was performed using the standard protocol during bolus administration of intravenous contrast. Multiplanar CT image reconstructions and MIPs were obtained to evaluate the vascular anatomy.  CONTRAST:  OMNIPAQUE IOHEXOL 350 MG/ML SOLN IV  COMPARISON:  None  FINDINGS: Scattered atherosclerotic calcifications aorta and minimally coronary arteries.  Aorta normal caliber without aneurysm or dissection.  Pulmonary arteries well opacified and patent.  No evidence of pulmonary embolism.  No thoracic adenopathy.  Visualized portion of upper abdomen normal appearance.  Emphysematous changes without infiltrate, pleural effusion or pneumothorax.  Subsegmental atelectasis at the medial RIGHT middle lobe and at the lingular base.  Bones appear demineralized.  Review of the MIP images confirms the above findings.  IMPRESSION: No evidence of pulmonary embolism.  Emphysematous changes with bibasilar atelectasis anteriorly.   Electronically Signed   By: Ulyses Southward M.D.   On: 06/18/2015 23:40     Microbiology: No results found for this or any previous visit (from the past 240 hour(s)).   Labs: Basic Metabolic Panel:  Recent Labs Lab 06/18/15 2047 06/19/15 0753 06/20/15 0620 06/21/15 0604 06/22/15 0623  NA 142 135 140 139 138  K 3.9 3.8 4.5 4.2 3.8  CL 102 99* 101 100* 99*  CO2 32 27 32 34* 34*  GLUCOSE 92 223* 138* 131* 146*  BUN CREATININE 0.74 0.82 0.71 0.68 0.65  CALCIUM 9.0 8.5* 9.1 8.9 8.7*   Liver Function Tests: No results for input(s): AST, ALT, ALKPHOS, BILITOT, PROT, ALBUMIN in the last 168 hours. No results for input(s): LIPASE, AMYLASE in the last 168 hours. No results for input(s): AMMONIA in the last 168 hours. CBC:  Recent Labs Lab 06/18/15 2047 06/19/15 0753 06/20/15 0620 06/21/15 0604 06/23/15 0637  WBC 9.7 7.9 19.4* 18.8* 12.4*  NEUTROABS 4.7  --   --   --   --   HGB 15.8* 15.1* 15.9* 15.9* 15.8*  HCT 48.7* 47.5* 49.2* 50.0* 48.7*  MCV 98.2 98.5 98.6  99.2 95.9  PLT 199 202 236 253 228   Cardiac Enzymes: No results for input(s): CKTOTAL, CKMB, CKMBINDEX, TROPONINI in the last 168 hours. BNP: BNP (last 3 results) No results for input(s): BNP in the last 8760 hours.  ProBNP (last 3 results) No results for input(s): PROBNP in the last 8760 hours.  CBG:  Recent Labs Lab 06/22/15 1131 06/22/15 1619 06/22/15 2333 06/23/15 0742 06/23/15 1134  GLUCAP 122* 135* 143* 98 86       Signed:  HERNANDEZ ACOSTA,ESTELA  [facility] Pager: [phone] 06/23/2015, 12:54 PM

## 2015-06-23 NOTE — Progress Notes (Signed)
1405 d/c instructions, paperwork and hard Rxs given to patient. No IV catheter or telemetry noted. Patient aware to pick up Rxs up at [facility]. Family to transport patient home, awaiting ride at this time.

## 2016-01-01 ENCOUNTER — Encounter (HOSPITAL_COMMUNITY): Payer: Self-pay | Admitting: Emergency Medicine

## 2016-01-01 ENCOUNTER — Observation Stay (HOSPITAL_COMMUNITY)
Admission: EM | Admit: 2016-01-01 | Discharge: 2016-01-03 | Disposition: A | Discharge status: Home / Self Care | Payer: Medicare Other | Attending: Internal Medicine | Admitting: Internal Medicine

## 2016-01-01 ENCOUNTER — Emergency Department (HOSPITAL_COMMUNITY): Payer: Medicare Other

## 2016-01-01 DIAGNOSIS — R0902 Hypoxemia: Secondary | ICD-10-CM

## 2016-01-01 DIAGNOSIS — F17210 Nicotine dependence, cigarettes, uncomplicated: Secondary | ICD-10-CM | POA: Insufficient documentation

## 2016-01-01 DIAGNOSIS — Z79899 Other long term (current) drug therapy: Secondary | ICD-10-CM | POA: Insufficient documentation

## 2016-01-01 DIAGNOSIS — Z79891 Long term (current) use of opiate analgesic: Secondary | ICD-10-CM | POA: Insufficient documentation

## 2016-01-01 DIAGNOSIS — Z720 Tobacco use: Secondary | ICD-10-CM | POA: Diagnosis present

## 2016-01-01 DIAGNOSIS — R0602 Shortness of breath: Secondary | ICD-10-CM | POA: Diagnosis present

## 2016-01-01 DIAGNOSIS — J441 Chronic obstructive pulmonary disease with (acute) exacerbation: Principal | ICD-10-CM | POA: Diagnosis present

## 2016-01-01 DIAGNOSIS — F419 Anxiety disorder, unspecified: Secondary | ICD-10-CM | POA: Diagnosis present

## 2016-01-01 HISTORY — DX: Chronic obstructive pulmonary disease, unspecified: J44.9

## 2016-01-01 LAB — BASIC METABOLIC PANEL
Anion gap: 9 (ref 5–15)
BUN: 12 mg/dL (ref 6–20)
CALCIUM: 8.2 mg/dL — AB (ref 8.9–10.3)
CO2: 28 mmol/L (ref 22–32)
CREATININE: 0.82 mg/dL (ref 0.44–1.00)
Chloride: 100 mmol/L — ABNORMAL LOW (ref 101–111)
GFR calc Af Amer: >60 mL/min (ref >60)
GFR calc non Af Amer: >60 mL/min (ref >60)
GLUCOSE: 130 mg/dL — AB (ref 65–99)
Potassium: 3.8 mmol/L (ref 3.5–5.1)
Sodium: 137 mmol/L (ref 135–145)

## 2016-01-01 LAB — CBC WITH DIFFERENTIAL/PLATELET
BASOS PCT: 0 %
Basophils Absolute: 0 10*3/uL (ref 0.0–0.1)
Eosinophils Absolute: 0.1 10*3/uL (ref 0.0–0.7)
Eosinophils Relative: 2 %
HEMATOCRIT: 40 % (ref 36.0–46.0)
Hemoglobin: 13.3 g/dL (ref 12.0–15.0)
LYMPHS PCT: 23 %
Lymphs Abs: 1.1 10*3/uL (ref 0.7–4.0)
MCH: 30.9 pg (ref 26.0–34.0)
MCHC: 33.3 g/dL (ref 30.0–36.0)
MCV: 93 fL (ref 78.0–100.0)
MONO ABS: 0.4 10*3/uL (ref 0.1–1.0)
MONOS PCT: 8 %
NEUTROS ABS: 3.1 10*3/uL (ref 1.7–7.7)
Neutrophils Relative %: 67 %
Platelets: 164 10*3/uL (ref 150–400)
RBC: 4.3 MIL/uL (ref 3.87–5.11)
RDW: 14.6 % (ref 11.5–15.5)
WBC: 4.7 10*3/uL (ref 4.0–10.5)

## 2016-01-01 MED ORDER — IPRATROPIUM-ALBUTEROL 0.5-2.5 (3) MG/3ML IN SOLN
3.0000 mL | Freq: Once | RESPIRATORY_TRACT | Status: AC
  Administered 2016-01-01: 3 mL via RESPIRATORY_TRACT
  Filled 2016-01-01: qty 3

## 2016-01-01 MED ORDER — ALBUTEROL SULFATE (2.5 MG/3ML) 0.083% IN NEBU
2.5000 mg | INHALATION_SOLUTION | Freq: Once | RESPIRATORY_TRACT | Status: AC
  Administered 2016-01-01: 2.5 mg via RESPIRATORY_TRACT
  Filled 2016-01-01: qty 3

## 2016-01-01 MED ORDER — ALBUTEROL SULFATE (2.5 MG/3ML) 0.083% IN NEBU: 5.0000 mg | INHALATION_SOLUTION | Freq: Once | RESPIRATORY_TRACT | Status: DC

## 2016-01-01 MED ORDER — IPRATROPIUM BROMIDE 0.02 % IN SOLN: 0.5000 mg | Freq: Once | RESPIRATORY_TRACT | Status: DC

## 2016-01-01 MED ORDER — METHYLPREDNISOLONE SODIUM SUCC 125 MG IJ SOLR
125.0000 mg | Freq: Once | INTRAMUSCULAR | Status: AC
  Administered 2016-01-01: 125 mg via INTRAVENOUS
  Filled 2016-01-01: qty 2

## 2016-01-01 MED ORDER — ACETAMINOPHEN 325 MG PO TABS
650.0000 mg | ORAL_TABLET | Freq: Once | ORAL | Status: AC
  Administered 2016-01-01: 650 mg via ORAL
  Filled 2016-01-01: qty 2

## 2016-01-01 NOTE — ED Provider Notes (Signed)
CSN: [id number]     Arrival date & time 01/01/16  2245 History  By signing my name below, I, [facility], attest that this documentation has been prepared under the direction and in the presence of Devoria Albe, MD at 2303. Electronically Signed: Randell Patient, ED Scribe. 01/01/2016. 3:09 AM.   Chief Complaint  Patient presents with  . Shortness of Breath   The history is provided by the patient. No language interpreter was used.  HPI Comments: Rebecca Hansen is a 65 y.o. female with COPD who presents to the Emergency Department complaining of intermittently.gradually worsening SOB ongoing for the past week. She feels like she is having allergy problems and states she can "taste the pollen" when she walks outside. Patient reports that she has been walking in the mornings and evenings up until 2 days ago when symptoms began and that when she checked her O2 saturation today it was 70%. She endorses nonproductive cough, nasal congestion, chills, subjective fever. She has used a nebulizer with temporary relief only. She states she went to bed at 5 PM however 8 PM she woke up and she felt a lot worse. She called her friend to bring her to the ED. She denies chest pain but states her chest hurts when she coughs. She regularly takes Symbicort, Xanax, albuterol, hydrocodone, and oxycodone. She smokes 2 cigarettes per day. Denies sore throat, rhinorrhea, wheezing.  Patient states last September she was admitted to the hospital at that time she was discharged home with oxygen. However when she was rechecked later on the office her oxygen was 96% And her oxygen was stopped.  PCP Dr Sudie Bailey   Past Medical History  Diagnosis Date  . COPD (chronic obstructive pulmonary disease) [facility])    Past Surgical History  Procedure Laterality Date  . Hysteroscopy     History reviewed. No pertinent family history. Social History  Substance Use Topics  . Smoking status: Current Every Day Smoker -- 0.50  packs/day    Types: Cigarettes  . Smokeless tobacco: None  . Alcohol Use: No   Lives at home Lives alone  OB History    No data available     Review of Systems  Constitutional: Positive for fever and chills.  HENT: Positive for congestion and voice change. Negative for rhinorrhea and sore throat.   Respiratory: Positive for cough (nonproductive) and shortness of breath. Negative for wheezing.   Cardiovascular: Positive for chest pain (with cough).  All other systems reviewed and are negative.   Allergies  Review of patient's allergies indicates no known allergies.  Home Medications   Patient states she takes albuterol inhaler, Xanax 3 times a day, Symbicort, hydrocodone 10/325, oxycodone and Spiriva. She also takes Prozac.  Prior to Admission medications   Medication Sig Start Date End Date Taking? Authorizing Provider  albuterol (PROVENTIL HFA;VENTOLIN HFA) 108 (90 BASE) MCG/ACT inhaler Inhale 2 puffs into the lungs every 4 (four) hours as needed for wheezing or shortness of breath. 06/23/15   Henderson Cloud, MD  albuterol (PROVENTIL) (2.5 MG/3ML) 0.083% nebulizer solution Take 3 mLs (2.5 mg total) by nebulization every 2 (two) hours as needed for wheezing or shortness of breath. 06/23/15   Henderson Cloud, MD  ALPRAZolam Prudy Feeler) 1 MG tablet Take 1 mg by mouth 4 (four) times daily as needed. 06/06/15   Historical Provider, MD  budesonide-formoterol (SYMBICORT) 80-4.5 MCG/ACT inhaler Inhale 2 puffs into the lungs 2 (two) times daily. 06/23/15   Micael Hampshire  Loreta AveAcosta, MD  HYDROcodone-acetaminophen [facility](NORCO) 10-325 MG per tablet Take 1 tablet by mouth every 8 (eight) hours as needed. 05/19/15   Historical Provider, MD  levofloxacin (LEVAQUIN) 750 MG tablet Take 1 tablet (750 mg total) by mouth daily. 06/23/15   Henderson CloudEstela Y Hernandez Acosta, MD  predniSONE (DELTASONE) 10 MG tablet Take 1 tablet (10 mg total) by mouth daily with breakfast. Take 6 tablets today and then  decrease by 1 tablet daily until none are left. 06/23/15   Henderson CloudEstela Y Hernandez Acosta, MD  tiotropium (SPIRIVA HANDIHALER) 18 MCG inhalation capsule Place 1 capsule (18 mcg total) into inhaler and inhale daily. 06/23/15   Henderson CloudEstela Y Hernandez Acosta, MD   ED Triage Vitals  Enc Vitals Group     BP 01/01/16 2254 147/61 mmHg     Pulse Rate 01/01/16 2254 117     Resp 01/01/16 2254 24     Temp 01/01/16 2254 101.1 F (38.4 C)     Temp Source 01/01/16 2254 Oral     SpO2 01/01/16 2254 96 %     Weight 01/01/16 2254 195 lb (88.451 kg)     Height 01/01/16 2254 5\' 8"  (1.727 m)     Head Cir --      Peak Flow --      Pain Score 01/01/16 2254 8     Pain Loc --      Pain Edu? --      Excl. in GC? --     Vital signs normal except for fever and tachycardia    Physical Exam  Constitutional: She is oriented to person, place, and time. She appears well-developed and well-nourished.  Non-toxic appearance. She does not appear ill. No distress.  Face is flushed.  HENT:  Head: Normocephalic and atraumatic.  Right Ear: External ear normal.  Left Ear: External ear normal.  Nose: Nose normal. No mucosal edema or rhinorrhea.  Mouth/Throat: Oropharynx is clear and moist and mucous membranes are normal. No dental abscesses or uvula swelling.  Eyes: Conjunctivae and EOM are normal. Pupils are equal, round, and reactive to light.  Neck: Normal range of motion and full passive range of motion without pain. Neck supple.  Cardiovascular: Normal rate, regular rhythm and normal heart sounds.  Exam reveals no gallop and no friction rub.   No murmur heard. Pulmonary/Chest: Effort normal and breath sounds normal. No respiratory distress. She has no wheezes. She has no rhonchi. She has no rales. She exhibits no tenderness and no crepitus.  Very diminished breath sounds.  Abdominal: Soft. Normal appearance and bowel sounds are normal. She exhibits no distension. There is no tenderness. There is no rebound and no guarding.   Musculoskeletal: Normal range of motion. She exhibits no edema or tenderness.  Moves all extremities well.   Neurological: She is alert and oriented to person, place, and time. She has normal strength. No cranial nerve deficit.  Skin: Skin is warm, dry and intact. No rash noted. No erythema. No pallor.  Psychiatric: She has a normal mood and affect. Her speech is normal and behavior is normal. Her mood appears not anxious.  Nursing note and vitals reviewed.   ED Course  Procedures   Medications  methylPREDNISolone sodium succinate (SOLU-MEDROL) 125 mg/2 mL injection 125 mg (125 mg Intravenous Given 01/01/16 2335)  ipratropium-albuterol (DUONEB) 0.5-2.5 (3) MG/3ML nebulizer solution 3 mL (3 mLs Nebulization Given 01/01/16 2331)  albuterol (PROVENTIL) (2.5 MG/3ML) 0.083% nebulizer solution 2.5 mg (2.5 mg Nebulization Given 01/01/16 2331)  acetaminophen (  TYLENOL) tablet 650 mg (650 mg Oral Given 01/01/16 2335)  albuterol (PROVENTIL,VENTOLIN) solution continuous neb (15 mg/hr Nebulization Given 01/02/16 0239)  ipratropium (ATROVENT) nebulizer solution 0.5 mg (0.5 mg Nebulization Given 01/02/16 0239)    DIAGNOSTIC STUDIES: Oxygen Saturation is 96% on RA, adequate by my interpretation.    COORDINATION OF CARE:   Patient had IV inserted, she was given IV steroids and had a albuterol/Atrovent nebulizer given. She was given acetaminophen for her fever.  Patient was rechecked at 2:10 AM. Nurse reports patient's oxygen has dropped down to 88% sitting on the bed and when they ambulated her her pulse ox dropped to 85%. When I listen to the patient she has some improved air movement but still diminished. There is no wheezing or rhonchi. A continuous nebulizer was ordered. We discussed need for admission due to her hypoxia and she is agreeable.  02:30 Dr Conley Rolls, hospitalist will admit  Review of the [facility] database shows patient gets #30 oxycodone 10/325 monthly, last filled March 19, #90 hydrocodone  10/325 filled monthly last filled March 19, and she is on #120 alprazolam 1 mg tablets last filled March 3. She also was prescribed for the first time #30 phentermine 37.5 mg tablets on March 15.  Labs Review Results for orders placed or performed during the hospital encounter of 01/01/16  Culture, blood (routine x 2)  Result Value Ref Range   Specimen Description BLOOD LEFT ARM    Special Requests BOTTLES DRAWN AEROBIC AND ANAEROBIC [facility] EACH    Culture PENDING    Report Status PENDING   Culture, blood (routine x 2)  Result Value Ref Range   Specimen Description BLOOD LEFT HAND    Special Requests BOTTLES DRAWN AEROBIC AND ANAEROBIC 7CC EACH    Culture PENDING    Report Status PENDING   CBC with Differential  Result Value Ref Range   WBC 4.7 4.0 - 10.5 K/uL   RBC 4.30 3.87 - 5.11 MIL/uL   Hemoglobin 13.3 12.0 - 15.0 g/dL   HCT 45.4 09.8 - 11.9 %   MCV 93.0 78.0 - 100.0 fL   MCH 30.9 26.0 - 34.0 pg   MCHC 33.3 30.0 - 36.0 g/dL   RDW 14.7 82.9 - 56.2 %   Platelets 164 150 - 400 K/uL   Neutrophils Relative % 67 %   Neutro Abs 3.1 1.7 - 7.7 K/uL   Lymphocytes Relative 23 %   Lymphs Abs 1.1 0.7 - 4.0 K/uL   Monocytes Relative 8 %   Monocytes Absolute 0.4 0.1 - 1.0 K/uL   Eosinophils Relative 2 %   Eosinophils Absolute 0.1 0.0 - 0.7 K/uL   Basophils Relative 0 %   Basophils Absolute 0.0 0.0 - 0.1 K/uL  Basic metabolic panel  Result Value Ref Range   Sodium 137 135 - 145 mmol/L   Potassium 3.8 3.5 - 5.1 mmol/L   Chloride 100 (L) 101 - 111 mmol/L   CO2 28 22 - 32 mmol/L   Glucose, Bld 130 (H) 65 - 99 mg/dL   BUN 12 6 - 20 mg/dL   Creatinine, Ser 1.30 0.44 - 1.00 mg/dL   Calcium 8.2 (L) 8.9 - 10.3 mg/dL   GFR calc non Af Amer >60 >60 mL/min   GFR calc Af Amer >60 >60 mL/min   Anion gap 9 5 - 15   Laboratory interpretation all normal except hyperglycemia   Imaging Review Dg Chest 2 View  01/02/2016  CLINICAL DATA:  Nonproductive cough. Chills  and fever. Duration 2 days.  EXAM: CHEST  2 VIEW COMPARISON:  06/18/2015 FINDINGS: There is moderate unchanged hyperinflation. There is chronic prominence of the interstitium. No alveolar opacities. No effusions. Normal heart size. Hilar and mediastinal contours are unremarkable and unchanged. IMPRESSION: Hyperinflation and chronic interstitial thickening. No significant interval change from 06/18/2015. Electronically Signed   By: Ellery Plunk M.D.   On: 01/02/2016 00:50   I have personally reviewed and evaluated these images and lab results as part of my medical decision-making.    MDM   Final diagnoses:  COPD exacerbation (HCC)  Hypoxia    Plan admission  CRITICAL CARE Performed by: Devoria Albe L Total critical care time: 35 minutes Critical care time was exclusive of separately billable procedures and treating other patients. Critical care was necessary to treat or prevent imminent or life-threatening deterioration. Critical care was time spent personally by me on the following activities: development of treatment plan with patient and/or surrogate as well as nursing, discussions with consultants, evaluation of patient's response to treatment, examination of patient, obtaining history from patient or surrogate, ordering and performing treatments and interventions, ordering and review of laboratory studies, ordering and review of radiographic studies, pulse oximetry and re-evaluation of patient's condition.   I personally performed the services described in this documentation, which was scribed in my presence. The recorded information has been reviewed and considered.  Devoria Albe, MD, Concha Pyo, MD 01/02/16 [phone]

## 2016-01-01 NOTE — ED Notes (Signed)
Called RT for breathing treatment

## 2016-01-01 NOTE — ED Notes (Signed)
Lab at the bedside 

## 2016-01-01 NOTE — ED Notes (Signed)
Pt c/o increased sob this week.

## 2016-01-02 ENCOUNTER — Encounter (HOSPITAL_COMMUNITY): Payer: Self-pay | Admitting: Internal Medicine

## 2016-01-02 DIAGNOSIS — F419 Anxiety disorder, unspecified: Secondary | ICD-10-CM

## 2016-01-02 DIAGNOSIS — Z720 Tobacco use: Secondary | ICD-10-CM

## 2016-01-02 DIAGNOSIS — J441 Chronic obstructive pulmonary disease with (acute) exacerbation: Secondary | ICD-10-CM | POA: Diagnosis not present

## 2016-01-02 LAB — GLUCOSE, CAPILLARY
GLUCOSE-CAPILLARY: 212 mg/dL — AB (ref 65–99)
GLUCOSE-CAPILLARY: 168 mg/dL — AB (ref 65–99)
GLUCOSE-CAPILLARY: 102 mg/dL — AB (ref 65–99)
GLUCOSE-CAPILLARY: 237 mg/dL — AB (ref 65–99)

## 2016-01-02 MED ORDER — ALBUTEROL SULFATE (2.5 MG/3ML) 0.083% IN NEBU: 2.5000 mg | INHALATION_SOLUTION | RESPIRATORY_TRACT | Status: DC | PRN

## 2016-01-02 MED ORDER — ALBUTEROL SULFATE (2.5 MG/3ML) 0.083% IN NEBU
2.5000 mg | INHALATION_SOLUTION | Freq: Four times a day (QID) | RESPIRATORY_TRACT | Status: DC
  Administered 2016-01-02 (×2): 2.5 mg via RESPIRATORY_TRACT
  Filled 2016-01-02 (×2): qty 3

## 2016-01-02 MED ORDER — ALBUTEROL SULFATE (2.5 MG/3ML) 0.083% IN NEBU
2.5000 mg | INHALATION_SOLUTION | Freq: Three times a day (TID) | RESPIRATORY_TRACT | Status: DC
  Administered 2016-01-02 – 2016-01-03 (×3): 2.5 mg via RESPIRATORY_TRACT
  Filled 2016-01-02 (×3): qty 3

## 2016-01-02 MED ORDER — ENOXAPARIN SODIUM 40 MG/0.4ML ~~LOC~~ SOLN
40.0000 mg | SUBCUTANEOUS | Status: DC
  Administered 2016-01-02 – 2016-01-03 (×2): 40 mg via SUBCUTANEOUS
  Filled 2016-01-02 (×2): qty 0.4

## 2016-01-02 MED ORDER — INSULIN ASPART 100 UNIT/ML ~~LOC~~ SOLN
0.0000 [IU] | Freq: Three times a day (TID) | SUBCUTANEOUS | Status: DC
  Administered 2016-01-02 (×2): 5 [IU] via SUBCUTANEOUS

## 2016-01-02 MED ORDER — MOMETASONE FURO-FORMOTEROL FUM 100-5 MCG/ACT IN AERO
2.0000 | INHALATION_SPRAY | Freq: Two times a day (BID) | RESPIRATORY_TRACT | Status: DC
  Administered 2016-01-02 – 2016-01-03 (×2): 2 via RESPIRATORY_TRACT
  Filled 2016-01-02: qty 8.8

## 2016-01-02 MED ORDER — TIOTROPIUM BROMIDE MONOHYDRATE 18 MCG IN CAPS
18.0000 ug | ORAL_CAPSULE | Freq: Every day | RESPIRATORY_TRACT | Status: DC
  Administered 2016-01-03: 18 ug via RESPIRATORY_TRACT
  Filled 2016-01-02: qty 5

## 2016-01-02 MED ORDER — HYDROCODONE-ACETAMINOPHEN 10-325 MG PO TABS
1.0000 | ORAL_TABLET | ORAL | Status: DC | PRN
  Administered 2016-01-02 – 2016-01-03 (×3): 1 via ORAL
  Filled 2016-01-02 (×3): qty 1

## 2016-01-02 MED ORDER — IPRATROPIUM BROMIDE 0.02 % IN SOLN
0.5000 mg | Freq: Once | RESPIRATORY_TRACT | Status: AC
  Administered 2016-01-02: 0.5 mg via RESPIRATORY_TRACT
  Filled 2016-01-02: qty 2.5

## 2016-01-02 MED ORDER — ALBUTEROL (5 MG/ML) CONTINUOUS INHALATION SOLN
15.0000 mg/h | INHALATION_SOLUTION | Freq: Once | RESPIRATORY_TRACT | Status: AC
  Administered 2016-01-02: 15 mg/h via RESPIRATORY_TRACT
  Filled 2016-01-02: qty 20

## 2016-01-02 MED ORDER — POTASSIUM CHLORIDE IN NACL 20-0.9 MEQ/L-% IV SOLN
INTRAVENOUS | Status: AC
  Administered 2016-01-02: 75 mL/h via INTRAVENOUS

## 2016-01-02 MED ORDER — ENOXAPARIN SODIUM 40 MG/0.4ML ~~LOC~~ SOLN: 40.0000 mg | SUBCUTANEOUS | Status: DC

## 2016-01-02 MED ORDER — ALPRAZOLAM 0.5 MG PO TABS
0.5000 mg | ORAL_TABLET | Freq: Three times a day (TID) | ORAL | Status: DC | PRN
  Administered 2016-01-02 – 2016-01-03 (×3): 0.5 mg via ORAL
  Filled 2016-01-02 (×3): qty 1

## 2016-01-02 MED ORDER — PREDNISONE 20 MG PO TABS
40.0000 mg | ORAL_TABLET | Freq: Every day | ORAL | Status: DC
  Administered 2016-01-02 – 2016-01-03 (×2): 40 mg via ORAL
  Filled 2016-01-02 (×2): qty 2

## 2016-01-02 MED ORDER — LEVOFLOXACIN 750 MG PO TABS
750.0000 mg | ORAL_TABLET | Freq: Every day | ORAL | Status: DC
  Administered 2016-01-02 – 2016-01-03 (×2): 750 mg via ORAL
  Filled 2016-01-02 (×2): qty 1

## 2016-01-02 MED ORDER — SODIUM CHLORIDE 0.9% FLUSH
3.0000 mL | Freq: Two times a day (BID) | INTRAVENOUS | Status: DC
  Administered 2016-01-02 (×2): 3 mL via INTRAVENOUS

## 2016-01-02 MED ORDER — INSULIN ASPART 100 UNIT/ML ~~LOC~~ SOLN
0.0000 [IU] | Freq: Every day | SUBCUTANEOUS | Status: DC
  Administered 2016-01-02: 2 [IU] via SUBCUTANEOUS

## 2016-01-02 NOTE — ED Notes (Signed)
Pt 02 ranges form 87 -92%, placed pt on 2 L 02

## 2016-01-02 NOTE — H&P (Signed)
[facility] History and Physical  Rebecca Hansen ZOX:[id number] DOB: 06/17/1951    PCP:   Milana Obey, MD   Chief Complaint: shortness of breath   HPI: Rebecca Hansen is an 65 y.o. female with past medical history of COPD with tobacco use presents with complaints of shortness of breath. She reports the SOB has been gradually worsening since onset of one week. She also reports associated nonproductive cough, nasal congestion, headache, chills, and subjective fever. She denies chest pain. Breathing treatment at home with mild relief and she reports noting her O2 to be in the 70s at home. Workup in ED revealed labs that were unremarkable. She was noted to have low grade fever but otherwise vital signs stable. Patient reports being on chronic oxygen last Sept but was discontinued due to improvement in saturation. While in the ED she was noted to destate into mid 80's while ambulating and placed on 2L [place]. She will be admitted for treatment of COPD exacerbation and acute respiratory failure.    Rewiew of Systems:  Constitutional: Negative for malaise,  No significant weight loss or weight gain Positive for subjective fever, chills.  Eyes: Negative for eye pain, redness and discharge, diplopia, visual changes, or flashes of light. ENMT: Negative for ear pain, hoarseness, nasal congestion, sinus pressure and sore throat. No headaches; tinnitus, drooling, or problem swallowing. Cardiovascular: Negative for chest pain, palpitations, and peripheral edema. ; No orthopnea, PND Positive for SOB, DOE, diaphoresis, Respiratory: Negative for hemoptysis, wheezing and stridor. No pleuritic chestpain. Positive for nonproductive cough  Gastrointestinal: Negative for nausea, vomiting, diarrhea, constipation, abdominal pain, melena, blood in stool, hematemesis, jaundice and rectal bleeding.    Genitourinary: Negative for frequency, dysuria, incontinence,flank pain and hematuria; Musculoskeletal:  Negative for back pain and neck pain. Negative for swelling and trauma.;  Skin: . Negative for pruritus, rash, abrasions, bruising and skin lesion.; ulcerations Neuro: Negative for headache, lightheadedness and neck stiffness. Negative for weakness, altered level of consciousness , altered mental status, extremity weakness, burning feet, involuntary movement, seizure and syncope.  Psych: negative for anxiety, depression, insomnia, tearfulness, panic attacks, hallucinations, paranoia, suicidal or homicidal ideation    Past Medical History  Diagnosis Date  . COPD (chronic obstructive pulmonary disease) [facility])     Past Surgical History  Procedure Laterality Date  . Hysteroscopy      Medications:  HOME MEDS: Prior to Admission medications   Medication Sig Start Date End Date Taking? Authorizing Provider  albuterol (PROVENTIL HFA;VENTOLIN HFA) 108 (90 BASE) MCG/ACT inhaler Inhale 2 puffs into the lungs every 4 (four) hours as needed for wheezing or shortness of breath. 06/23/15   Henderson Cloud, MD  albuterol (PROVENTIL) (2.5 MG/3ML) 0.083% nebulizer solution Take 3 mLs (2.5 mg total) by nebulization every 2 (two) hours as needed for wheezing or shortness of breath. 06/23/15   Henderson Cloud, MD  ALPRAZolam Prudy Feeler) 1 MG tablet Take 1 mg by mouth 4 (four) times daily as needed. 06/06/15   Historical Provider, MD  budesonide-formoterol (SYMBICORT) 80-4.5 MCG/ACT inhaler Inhale 2 puffs into the lungs 2 (two) times daily. 06/23/15   Henderson Cloud, MD  HYDROcodone-acetaminophen (NORCO) 10-325 MG per tablet Take 1 tablet by mouth every 8 (eight) hours as needed. 05/19/15   Historical Provider, MD  levofloxacin (LEVAQUIN) 750 MG tablet Take 1 tablet (750 mg total) by mouth daily. 06/23/15   Henderson Cloud, MD  predniSONE (DELTASONE) 10 MG tablet Take 1 tablet (10 mg  total) by mouth daily with breakfast. Take 6 tablets today and then decrease by 1 tablet daily until  none are left. 06/23/15   Henderson CloudEstela Y Hernandez Acosta, MD  tiotropium (SPIRIVA HANDIHALER) 18 MCG inhalation capsule Place 1 capsule (18 mcg total) into inhaler and inhale daily. 06/23/15   Henderson CloudEstela Y Hernandez Acosta, MD     Allergies:  No Known Allergies  Social History:   reports that she has been smoking Cigarettes.  She has been smoking about 0.50 packs per day. She does not have any smokeless tobacco history on file. She reports that she does not drink alcohol. Her drug history is not on file.  Family History: History reviewed. No pertinent family history.   Physical Exam: Filed Vitals:   01/02/16 0030 01/02/16 0033 01/02/16 0100 01/02/16 0130  BP: 100/44 111/47 103/55 103/51  Pulse: 103 104 101 93  Temp:  99.3 F (37.4 C)  99.3 F (37.4 C)  TempSrc:  Oral    Resp:  15    Height:      Weight:      SpO2: 94% 92% 87% 92%   Blood pressure 103/51, pulse 93, temperature 99.3 F (37.4 C), temperature source Oral, resp. rate 15, height 5\' 8"  (1.727 m), weight 88.451 kg (195 lb), SpO2 92 %.  GEN:  Pleasant  patient lying in the stretcher in no acute distress; cooperative with exam. PSYCH:  alert and oriented x4; does not appear anxious or depressed; affect is appropriate. HEENT: Mucous membranes pink and anicteric; PERRLA; EOM intact; no cervical lymphadenopathy nor thyromegaly or carotid bruit; no JVD; There were no stridor. Neck is very supple. Breasts:: Not examined CHEST WALL: No tenderness CHEST:  Decreased breath sounds. No crackles. Mild wheezes. HEART: Regular rate and rhythm.  There are no murmur, rub, or gallops.   BACK: No kyphosis or scoliosis; no CVA tenderness ABDOMEN: soft and non-tender; no masses, no organomegaly, normal abdominal bowel sounds; no pannus; no intertriginous candida. There is no rebound and no distention. Rectal Exam: Not done EXTREMITIES: No bone or joint deformity; age-appropriate arthropathy of the hands and knees; no edema; no ulcerations.  There  is no calf tenderness. Genitalia: not examined PULSES: 2+ and symmetric SKIN: Normal hydration no rash or ulceration CNS: Cranial nerves 2-12 grossly intact no focal lateralizing neurologic deficit.  Speech is fluent; uvula elevated with phonation, facial symmetry and tongue midline. DTR are normal bilaterally, cerebella exam is intact, barbinski is negative and strengths are equaled bilaterally.  No sensory loss.   Labs on Admission:  Basic Metabolic Panel:  Recent Labs Lab 01/01/16 2328  NA 137  K 3.8  CL 100*  CO2 28  GLUCOSE 130*  BUN 12  CREATININE 0.82  CALCIUM 8.2*     Recent Labs Lab 01/01/16 2328  WBC 4.7  NEUTROABS 3.1  HGB 13.3  HCT 40.0  MCV 93.0  PLT 164    Radiological Exams on Admission: Dg Chest 2 View  01/02/2016  CLINICAL DATA:  Nonproductive cough. Chills and fever. Duration 2 days. EXAM: CHEST  2 VIEW COMPARISON:  06/18/2015 FINDINGS: There is moderate unchanged hyperinflation. There is chronic prominence of the interstitium. No alveolar opacities. No effusions. Normal heart size. Hilar and mediastinal contours are unremarkable and unchanged. IMPRESSION: Hyperinflation and chronic interstitial thickening. No significant interval change from 06/18/2015. Electronically Signed   By: Ellery Plunkaniel R Mitchell M.D.   On: 01/02/2016 00:50    Assessment/Plan 1. COPD exacerbation, started on oral Levaquin, oral steroids and breathing  treatments.  2. Acute respiratory failure with hypoxia. Placed on 2L [place]. Try to wean as tolerated.  She may need home oxygen.   Houston Siren, MD. FACP [facility] Pager [phone] 7pm to 7am.  01/02/2016, 2:30 AM   By signing my name below, I, Zadie Cleverly, attest that this documentation has been prepared under the direction and in the presence of Houston Siren, MD. Electronically signed: Zadie Cleverly, Scribe. 01/02/2016  2:30am   I, Dr. Houston Siren, personally performed the services described in this documentation. All medical  record entries made by the scribe were at my discretion and in my presence. Houston Siren, MD 01/02/2016

## 2016-01-02 NOTE — ED Notes (Signed)
Took pt off 02, after one minute, pt was 88% on room air, walking around nursing station 85% on room air, pt back in bed on 2L 02, now 92%, MD aware

## 2016-01-02 NOTE — Progress Notes (Signed)
Upon RN arrival in pt room, pt sitting in floor with IV out and bleeding.  States she sat on floor because she got tired and tangled up in IV and oxygen tubing.  No visible injuries, no c/o pain. Vitals: 124/76, HR 98, 97% on 2L.  Assisted back to bed. MD notified.  Will continue to monitor.

## 2016-01-02 NOTE — Progress Notes (Signed)
Patient is a 65 year old woman with a history of COPD and tobacco use, who was admitted by Dr. Conley RollsLe this morning for COPD with exacerbation and acute respiratory failure with mild hypoxia. She was briefly seen and examined. Her chart, vital signs, laboratory studies were reviewed. Agree with current management with additions below.  -We'll add sliding scale NovoLog for mild steroid-induced hyperglycemia. -We'll add gentle IV fluids.

## 2016-01-03 DIAGNOSIS — J441 Chronic obstructive pulmonary disease with (acute) exacerbation: Secondary | ICD-10-CM | POA: Diagnosis not present

## 2016-01-03 LAB — GLUCOSE, CAPILLARY
Glucose-Capillary: 115 mg/dL — ABNORMAL HIGH (ref 65–99)
Glucose-Capillary: 103 mg/dL — ABNORMAL HIGH (ref 65–99)
Glucose-Capillary: 110 mg/dL — ABNORMAL HIGH (ref 65–99)

## 2016-01-03 MED ORDER — PREDNISONE 10 MG PO TABS: 10.0000 mg | ORAL_TABLET | Freq: Every day | ORAL | Status: DC

## 2016-01-03 MED ORDER — LEVOFLOXACIN 750 MG PO TABS: 750.0000 mg | ORAL_TABLET | Freq: Every day | ORAL | Status: DC

## 2016-01-03 NOTE — Care Management Note (Signed)
Case Management Note  Patient Details  Name: Cecile SheererRoxie A Campo MRN: [id number] Date of Birth: 1951-01-26  Subjective/Objective:      Spoke with patient for discharge planning.    Patient is from home alone , drives self and independent.  PCP Dr Phyllis GingerNollan, No home O2 but has had it in the past. Patient stated that she does not want [facility]  for DME prorvider if O2 needed at discharge.   States walks frequently with friends in the park, thinks pollen exacerbated her COPD. Marland Kitchen.  Doesn't meet home bound.        Action/Plan: Home with self care. May need home O2   Expected Discharge Date:                  Expected Discharge Plan:  Home/Self Care  In-House Referral:     Discharge planning Services  CM Consult  Post Acute Care Choice:    Choice offered to:     DME Arranged:    DME Agency:     HH Arranged:    HH Agency:     Status of Service:  In process, will continue to follow  Medicare Important Message Given:    Date Medicare IM Given:    Medicare IM give by:    Date Additional Medicare IM Given:    Additional Medicare Important Message give by:     If discussed at Long Length of Stay Meetings, dates discussed:    Additional Comments:  Adonis HugueninBerkhead, Clementine Soulliere L, RN 01/03/2016, 9:25 AM

## 2016-01-03 NOTE — Discharge Summary (Addendum)
Physician Discharge Summary  Rebecca Hansen ZOX:[id number] DOB: Jun 25, 1951 DOA: 01/01/2016  PCP: Milana Obey, MD  Admit date: 01/01/2016 Discharge date: 01/03/2016  Time spent: 45 minutes  Recommendations for Outpatient Follow-up:  -Will be discharged home today. -Advised to follow-up with primary care provider in 2 weeks.   Discharge Diagnoses:  Principal Problem:   COPD with acute exacerbation (HCC) Active Problems:   Tobacco abuse   Anxiety   COPD exacerbation (HCC)   Discharge Condition: Stable and improved  Filed Weights   01/01/16 2254 01/02/16 0414  Weight: 88.451 kg (195 lb) 88.225 kg (194 lb 8 oz)    History of present illness:  As per Dr. Conley Rolls on 4/4: Rebecca Hansen is an 65 y.o. female with past medical history of COPD with tobacco use presents with complaints of shortness of breath. She reports the SOB has been gradually worsening since onset of one week. She also reports associated nonproductive cough, nasal congestion, headache, chills, and subjective fever. She denies chest pain. Breathing treatment at home with mild relief and she reports noting her O2 to be in the 70s at home. Workup in ED revealed labs that were unremarkable. She was noted to have low grade fever but otherwise vital signs stable. Patient reports being on chronic oxygen last Sept but was discontinued due to improvement in saturation. While in the ED she was noted to destate into mid 80's while ambulating and placed on 2L . She will be admitted for treatment of COPD exacerbation and acute respiratory failure.   Hospital Course:   COPD with acute exacerbation/Acute on chronic hypoxemic respiratory failure -Markedly symptomatically improved. -We'll discharge home today on prednisone taper, 5 days of Levaquin, will also continue her home regimen of inhalers which appears to be appropriate. -Oxygen saturations remained in the 80s on room air, will be discharged home on oxygen.  Rest of  chronic conditions are stable and home medications have not been altered.  Procedures:  None   Consultations:  None  Discharge Instructions  Discharge Instructions    Increase activity slowly    Complete by:  As directed             Medication List    STOP taking these medications        oxyCODONE-acetaminophen 10-325 MG tablet  Commonly known as:  PERCOCET      TAKE these medications        albuterol 108 (90 Base) MCG/ACT inhaler  Commonly known as:  PROVENTIL HFA;VENTOLIN HFA  Inhale 2 puffs into the lungs every 4 (four) hours as needed for wheezing or shortness of breath.     albuterol (2.5 MG/3ML) 0.083% nebulizer solution  Commonly known as:  PROVENTIL  Take 3 mLs (2.5 mg total) by nebulization every 2 (two) hours as needed for wheezing or shortness of breath.     ALPRAZolam 1 MG tablet  Commonly known as:  XANAX  Take 1 mg by mouth every morning. and one at lunch and 2 at bedtime.     budesonide-formoterol 80-4.5 MCG/ACT inhaler  Commonly known as:  SYMBICORT  Inhale 2 puffs into the lungs 2 (two) times daily.     FLUoxetine 20 MG capsule  Commonly known as:  PROZAC  TK 1 C PO ONCE D FOR DEPRESSION     HYDROcodone-acetaminophen 10-325 MG tablet  Commonly known as:  NORCO  Take 1 tablet by mouth every 8 (eight) hours as needed.     levofloxacin 750 MG  tablet  Commonly known as:  LEVAQUIN  Take 1 tablet (750 mg total) by mouth daily.     predniSONE 10 MG tablet  Commonly known as:  DELTASONE  Take 1 tablet (10 mg total) by mouth daily with breakfast. Take 6 tablets today and then decrease by 1 tablet daily until none are left.     tiotropium 18 MCG inhalation capsule  Commonly known as:  SPIRIVA HANDIHALER  Place 1 capsule (18 mcg total) into inhaler and inhale daily.       No Known Allergies     Follow-up Information    Follow up with Milana ObeyKNOWLTON,STEPHEN D, MD. Schedule an appointment as soon as possible for a visit in 2 weeks.   Specialty:   Family Medicine   Contact information:   [address]  [place] [id number] [phone]        The results of significant diagnostics from this hospitalization (including imaging, microbiology, ancillary and laboratory) are listed below for reference.    Significant Diagnostic Studies: Dg Chest 2 View  01/02/2016  CLINICAL DATA:  Nonproductive cough. Chills and fever. Duration 2 days. EXAM: CHEST  2 VIEW COMPARISON:  06/18/2015 FINDINGS: There is moderate unchanged hyperinflation. There is chronic prominence of the interstitium. No alveolar opacities. No effusions. Normal heart size. Hilar and mediastinal contours are unremarkable and unchanged. IMPRESSION: Hyperinflation and chronic interstitial thickening. No significant interval change from 06/18/2015. Electronically Signed   By: Ellery Plunkaniel R Mitchell M.D.   On: 01/02/2016 00:50    Microbiology: Recent Results (from the past 240 hour(s))  Culture, blood (routine x 2)     Status: None (Preliminary result)   Collection Time: 01/01/16 11:28 PM  Result Value Ref Range Status   Specimen Description BLOOD LEFT ARM  Final   Special Requests BOTTLES DRAWN AEROBIC AND ANAEROBIC 7CC EACH  Final   Culture NO GROWTH 2 DAYS  Final   Report Status PENDING  Incomplete  Culture, blood (routine x 2)     Status: None (Preliminary result)   Collection Time: 01/01/16 11:28 PM  Result Value Ref Range Status   Specimen Description BLOOD LEFT HAND  Final   Special Requests BOTTLES DRAWN AEROBIC AND ANAEROBIC 7CC EACH  Final   Culture NO GROWTH 2 DAYS  Final   Report Status PENDING  Incomplete     Labs: Basic Metabolic Panel:  Recent Labs Lab 01/01/16 2328  NA 137  K 3.8  CL 100*  CO2 28  GLUCOSE 130*  BUN 12  CREATININE 0.82  CALCIUM 8.2*   Liver Function Tests: No results for input(s): AST, ALT, ALKPHOS, BILITOT, PROT, ALBUMIN in the last 168 hours. No results for input(s): LIPASE, AMYLASE in the last 168 hours. No results for  input(s): AMMONIA in the last 168 hours. CBC:  Recent Labs Lab 01/01/16 2328  WBC 4.7  NEUTROABS 3.1  HGB 13.3  HCT 40.0  MCV 93.0  PLT 164   Cardiac Enzymes: No results for input(s): CKTOTAL, CKMB, CKMBINDEX, TROPONINI in the last 168 hours. BNP: BNP (last 3 results) No results for input(s): BNP in the last 8760 hours.  ProBNP (last 3 results) No results for input(s): PROBNP in the last 8760 hours.  CBG:  Recent Labs Lab 01/02/16 1614 01/02/16 2156 01/03/16 0758 01/03/16 1136 01/03/16 1650  GLUCAP 102* 237* 115* 103* 110*       Signed:  HERNANDEZ ACOSTA,ESTELA  [facility] Pager: [phone] 01/03/2016, 5:06 PM

## 2016-01-03 NOTE — Progress Notes (Signed)
Reviewed discharge instructions with pt and answered questions.

## 2016-01-03 NOTE — Care Management Obs Status (Signed)
MEDICARE OBSERVATION STATUS NOTIFICATION   Patient Details  Name: Rebecca SheererRoxie A Blau MRN: [id number] Date of Birth: 08/14/1951   Medicare Observation Status Notification Given:  Yes    Adonis HugueninBerkhead, Miliani Deike L, RN 01/03/2016, 9:21 AM

## 2016-01-03 NOTE — Progress Notes (Signed)
Removed Pt IV and telemetry, tolerated well.

## 2016-01-03 NOTE — Progress Notes (Signed)
SATURATION QUALIFICATIONS: (This note is used to comply with regulatory documentation for home oxygen)  Patient Saturations on Room Air at Rest = 85%  Patient Saturations on Room Air while Ambulating = since resting sat on RA low didn't walk without O2  Patient Saturations on 2 Liters of oxygen while Ambulating = 85- 89%  Please briefly explain why patient needs home oxygen: pt saturations below 90 at rest as well as ambulating

## 2016-01-06 LAB — CULTURE, BLOOD (ROUTINE X 2)
CULTURE: NO GROWTH
Culture: NO GROWTH

## 2019-10-02 ENCOUNTER — Emergency Department (HOSPITAL_COMMUNITY): Payer: Medicare Other

## 2019-10-02 ENCOUNTER — Other Ambulatory Visit: Payer: Self-pay

## 2019-10-02 ENCOUNTER — Encounter (HOSPITAL_COMMUNITY): Payer: Self-pay

## 2019-10-02 ENCOUNTER — Inpatient Hospital Stay (HOSPITAL_COMMUNITY)
Admission: EM | Admit: 2019-10-02 | Discharge: 2019-10-08 | DRG: 177 | Disposition: A | Discharge status: Home / Self Care | Payer: Medicare Other | Attending: Internal Medicine | Admitting: Internal Medicine

## 2019-10-02 DIAGNOSIS — J440 Chronic obstructive pulmonary disease with acute lower respiratory infection: Secondary | ICD-10-CM | POA: Diagnosis present

## 2019-10-02 DIAGNOSIS — J069 Acute upper respiratory infection, unspecified: Secondary | ICD-10-CM | POA: Diagnosis not present

## 2019-10-02 DIAGNOSIS — D6959 Other secondary thrombocytopenia: Secondary | ICD-10-CM | POA: Diagnosis present

## 2019-10-02 DIAGNOSIS — D696 Thrombocytopenia, unspecified: Secondary | ICD-10-CM | POA: Diagnosis present

## 2019-10-02 DIAGNOSIS — F329 Major depressive disorder, single episode, unspecified: Secondary | ICD-10-CM | POA: Diagnosis present

## 2019-10-02 DIAGNOSIS — R0602 Shortness of breath: Secondary | ICD-10-CM | POA: Diagnosis present

## 2019-10-02 DIAGNOSIS — Z79899 Other long term (current) drug therapy: Secondary | ICD-10-CM | POA: Diagnosis not present

## 2019-10-02 DIAGNOSIS — R739 Hyperglycemia, unspecified: Secondary | ICD-10-CM | POA: Diagnosis present

## 2019-10-02 DIAGNOSIS — J449 Chronic obstructive pulmonary disease, unspecified: Secondary | ICD-10-CM | POA: Diagnosis not present

## 2019-10-02 DIAGNOSIS — A0839 Other viral enteritis: Secondary | ICD-10-CM | POA: Diagnosis present

## 2019-10-02 DIAGNOSIS — R112 Nausea with vomiting, unspecified: Secondary | ICD-10-CM | POA: Diagnosis present

## 2019-10-02 DIAGNOSIS — R0902 Hypoxemia: Secondary | ICD-10-CM

## 2019-10-02 DIAGNOSIS — K5900 Constipation, unspecified: Secondary | ICD-10-CM | POA: Diagnosis present

## 2019-10-02 DIAGNOSIS — F419 Anxiety disorder, unspecified: Secondary | ICD-10-CM | POA: Diagnosis present

## 2019-10-02 DIAGNOSIS — Z7952 Long term (current) use of systemic steroids: Secondary | ICD-10-CM | POA: Diagnosis not present

## 2019-10-02 DIAGNOSIS — J1282 Pneumonia due to coronavirus disease 2019: Secondary | ICD-10-CM | POA: Diagnosis present

## 2019-10-02 DIAGNOSIS — E876 Hypokalemia: Secondary | ICD-10-CM | POA: Diagnosis present

## 2019-10-02 DIAGNOSIS — G894 Chronic pain syndrome: Secondary | ICD-10-CM | POA: Diagnosis present

## 2019-10-02 DIAGNOSIS — Z79891 Long term (current) use of opiate analgesic: Secondary | ICD-10-CM

## 2019-10-02 DIAGNOSIS — G4700 Insomnia, unspecified: Secondary | ICD-10-CM | POA: Diagnosis present

## 2019-10-02 DIAGNOSIS — F1210 Cannabis abuse, uncomplicated: Secondary | ICD-10-CM | POA: Diagnosis present

## 2019-10-02 DIAGNOSIS — J9621 Acute and chronic respiratory failure with hypoxia: Secondary | ICD-10-CM | POA: Diagnosis present

## 2019-10-02 DIAGNOSIS — Z7951 Long term (current) use of inhaled steroids: Secondary | ICD-10-CM | POA: Diagnosis not present

## 2019-10-02 DIAGNOSIS — U071 COVID-19: Principal | ICD-10-CM | POA: Diagnosis present

## 2019-10-02 DIAGNOSIS — Z87891 Personal history of nicotine dependence: Secondary | ICD-10-CM

## 2019-10-02 DIAGNOSIS — R1110 Vomiting, unspecified: Secondary | ICD-10-CM

## 2019-10-02 DIAGNOSIS — J181 Lobar pneumonia, unspecified organism: Secondary | ICD-10-CM

## 2019-10-02 DIAGNOSIS — Z90710 Acquired absence of both cervix and uterus: Secondary | ICD-10-CM | POA: Diagnosis not present

## 2019-10-02 LAB — FERRITIN: Ferritin: 154 ng/mL (ref 11–307)

## 2019-10-02 LAB — CBC WITH DIFFERENTIAL/PLATELET
Abs Immature Granulocytes: 0.04 10*3/uL (ref 0.00–0.07)
Basophils Absolute: 0 10*3/uL (ref 0.0–0.1)
Basophils Relative: 0 %
Eosinophils Absolute: 0 10*3/uL (ref 0.0–0.5)
Eosinophils Relative: 0 %
HCT: 34.9 % — ABNORMAL LOW (ref 36.0–46.0)
Hemoglobin: 11.4 g/dL — ABNORMAL LOW (ref 12.0–15.0)
Immature Granulocytes: 1 %
Lymphocytes Relative: 9 %
Lymphs Abs: 0.8 10*3/uL (ref 0.7–4.0)
MCH: 30.6 pg (ref 26.0–34.0)
MCHC: 32.7 g/dL (ref 30.0–36.0)
MCV: 93.6 fL (ref 80.0–100.0)
Monocytes Absolute: 0.5 10*3/uL (ref 0.1–1.0)
Monocytes Relative: 6 %
Neutro Abs: 6.8 10*3/uL (ref 1.7–7.7)
Neutrophils Relative %: 84 %
Platelets: 133 10*3/uL — ABNORMAL LOW (ref 150–400)
RBC: 3.73 MIL/uL — ABNORMAL LOW (ref 3.87–5.11)
RDW: 14.1 % (ref 11.5–15.5)
WBC: 8.1 10*3/uL (ref 4.0–10.5)
nRBC: 0 % (ref 0.0–0.2)

## 2019-10-02 LAB — LACTATE DEHYDROGENASE: LDH: 142 U/L (ref 98–192)

## 2019-10-02 LAB — COMPREHENSIVE METABOLIC PANEL
ALT: 19 U/L (ref 0–44)
AST: 26 U/L (ref 15–41)
Albumin: 3.2 g/dL — ABNORMAL LOW (ref 3.5–5.0)
Alkaline Phosphatase: 109 U/L (ref 38–126)
Anion gap: 10 (ref 5–15)
BUN: 11 mg/dL (ref 8–23)
CO2: 25 mmol/L (ref 22–32)
Calcium: 8.2 mg/dL — ABNORMAL LOW (ref 8.9–10.3)
Chloride: 97 mmol/L — ABNORMAL LOW (ref 98–111)
Creatinine, Ser: 0.71 mg/dL (ref 0.44–1.00)
GFR calc Af Amer: >60 mL/min (ref >60)
GFR calc non Af Amer: >60 mL/min (ref >60)
Glucose, Bld: 107 mg/dL — ABNORMAL HIGH (ref 70–99)
Potassium: 3.4 mmol/L — ABNORMAL LOW (ref 3.5–5.1)
Sodium: 132 mmol/L — ABNORMAL LOW (ref 135–145)
Total Bilirubin: 0.5 mg/dL (ref 0.3–1.2)
Total Protein: 6.9 g/dL (ref 6.5–8.1)

## 2019-10-02 LAB — LACTIC ACID, PLASMA
Lactic Acid, Venous: 1.4 mmol/L (ref 0.5–1.9)
Lactic Acid, Venous: 1.2 mmol/L (ref 0.5–1.9)

## 2019-10-02 LAB — BLOOD GAS, VENOUS
Acid-Base Excess: 2.7 mmol/L — ABNORMAL HIGH (ref 0.0–2.0)
Bicarbonate: 26.8 mmol/L (ref 20.0–28.0)
FIO2: 28
O2 Saturation: 89.4 %
Patient temperature: 37.2
pCO2, Ven: 36.8 mmHg — ABNORMAL LOW (ref 44.0–60.0)
pH, Ven: 7.466 — ABNORMAL HIGH (ref 7.250–7.430)
pO2, Ven: 52.3 mmHg — ABNORMAL HIGH (ref 32.0–45.0)

## 2019-10-02 LAB — GLUCOSE, CAPILLARY: Glucose-Capillary: 160 mg/dL — ABNORMAL HIGH (ref 70–99)

## 2019-10-02 LAB — C-REACTIVE PROTEIN: CRP: 21 mg/dL — ABNORMAL HIGH (ref <1.0)

## 2019-10-02 LAB — POC SARS CORONAVIRUS 2 AG -  ED: SARS Coronavirus 2 Ag: POSITIVE — AB

## 2019-10-02 LAB — PROCALCITONIN: Procalcitonin: 2.1 ng/mL

## 2019-10-02 LAB — FIBRINOGEN: Fibrinogen: 712 mg/dL — ABNORMAL HIGH (ref 210–475)

## 2019-10-02 LAB — TRIGLYCERIDES: Triglycerides: 112 mg/dL (ref <150)

## 2019-10-02 LAB — D-DIMER, QUANTITATIVE: D-Dimer, Quant: 1.43 ug/mL-FEU — ABNORMAL HIGH (ref 0.00–0.50)

## 2019-10-02 LAB — ABO/RH: ABO/RH(D): O NEG

## 2019-10-02 MED ORDER — METHYLPREDNISOLONE SODIUM SUCC 500 MG IJ SOLR: 0.5000 mg/kg | Freq: Two times a day (BID) | INTRAMUSCULAR | Status: DC

## 2019-10-02 MED ORDER — TRAZODONE HCL 50 MG PO TABS
100.0000 mg | ORAL_TABLET | Freq: Every day | ORAL | Status: DC
  Administered 2019-10-02 – 2019-10-07 (×6): 100 mg via ORAL
  Filled 2019-10-02 (×6): qty 2

## 2019-10-02 MED ORDER — ALPRAZOLAM 0.5 MG PO TABS
0.5000 mg | ORAL_TABLET | Freq: Three times a day (TID) | ORAL | Status: DC | PRN
  Administered 2019-10-03 – 2019-10-07 (×12): 0.5 mg via ORAL
  Filled 2019-10-02 (×12): qty 1

## 2019-10-02 MED ORDER — SODIUM CHLORIDE 0.9 % IV SOLN
100.0000 mg | Freq: Every day | INTRAVENOUS | Status: AC
  Administered 2019-10-03 – 2019-10-06 (×4): 100 mg via INTRAVENOUS
  Filled 2019-10-02: qty 100
  Filled 2019-10-02 (×3): qty 20

## 2019-10-02 MED ORDER — ALBUTEROL SULFATE HFA 108 (90 BASE) MCG/ACT IN AERS
6.0000 | INHALATION_SPRAY | Freq: Once | RESPIRATORY_TRACT | Status: DC
  Filled 2019-10-02: qty 6.7

## 2019-10-02 MED ORDER — SODIUM CHLORIDE 0.9% FLUSH
3.0000 mL | Freq: Two times a day (BID) | INTRAVENOUS | Status: DC
  Administered 2019-10-02 – 2019-10-08 (×11): 3 mL via INTRAVENOUS

## 2019-10-02 MED ORDER — SODIUM CHLORIDE 0.9 % IV SOLN
1.0000 g | INTRAVENOUS | Status: AC
  Administered 2019-10-02 – 2019-10-04 (×3): 1 g via INTRAVENOUS
  Filled 2019-10-02 (×3): qty 10

## 2019-10-02 MED ORDER — SODIUM CHLORIDE 0.9 % IV SOLN
200.0000 mg | Freq: Once | INTRAVENOUS | Status: AC
  Administered 2019-10-02: 18:00:00 200 mg via INTRAVENOUS
  Filled 2019-10-02: qty 40

## 2019-10-02 MED ORDER — ACETAMINOPHEN 650 MG RE SUPP: 650.0000 mg | Freq: Four times a day (QID) | RECTAL | Status: DC | PRN

## 2019-10-02 MED ORDER — METHYLPREDNISOLONE SODIUM SUCC 40 MG IJ SOLR
40.0000 mg | Freq: Two times a day (BID) | INTRAMUSCULAR | Status: DC
  Administered 2019-10-02 – 2019-10-08 (×12): 40 mg via INTRAVENOUS
  Filled 2019-10-02 (×13): qty 1

## 2019-10-02 MED ORDER — ZINC SULFATE 220 (50 ZN) MG PO CAPS
220.0000 mg | ORAL_CAPSULE | Freq: Every day | ORAL | Status: DC
  Administered 2019-10-02 – 2019-10-08 (×6): 220 mg via ORAL
  Filled 2019-10-02 (×6): qty 1

## 2019-10-02 MED ORDER — ALPRAZOLAM 0.5 MG PO TABS
1.0000 mg | ORAL_TABLET | Freq: Once | ORAL | Status: AC
  Administered 2019-10-02: 1 mg via ORAL
  Filled 2019-10-02: qty 2

## 2019-10-02 MED ORDER — IPRATROPIUM-ALBUTEROL 20-100 MCG/ACT IN AERS
4.0000 | INHALATION_SPRAY | Freq: Once | RESPIRATORY_TRACT | Status: AC
  Administered 2019-10-02: 4 via RESPIRATORY_TRACT
  Filled 2019-10-02 (×2): qty 4

## 2019-10-02 MED ORDER — GUAIFENESIN-DM 100-10 MG/5ML PO SYRP
10.0000 mL | ORAL_SOLUTION | ORAL | Status: DC | PRN
  Administered 2019-10-05 – 2019-10-08 (×3): 10 mL via ORAL
  Filled 2019-10-02 (×3): qty 10

## 2019-10-02 MED ORDER — UMECLIDINIUM BROMIDE 62.5 MCG/INH IN AEPB
INHALATION_SPRAY | RESPIRATORY_TRACT | Status: AC
  Filled 2019-10-02: qty 7

## 2019-10-02 MED ORDER — THIAMINE HCL 100 MG PO TABS
100.0000 mg | ORAL_TABLET | Freq: Every day | ORAL | Status: DC
  Administered 2019-10-02 – 2019-10-08 (×7): 100 mg via ORAL
  Filled 2019-10-02 (×7): qty 1

## 2019-10-02 MED ORDER — ONDANSETRON HCL 4 MG/2ML IJ SOLN
4.0000 mg | Freq: Four times a day (QID) | INTRAMUSCULAR | Status: DC | PRN
  Administered 2019-10-02 – 2019-10-04 (×4): 4 mg via INTRAVENOUS
  Filled 2019-10-02 (×5): qty 2

## 2019-10-02 MED ORDER — IPRATROPIUM BROMIDE HFA 17 MCG/ACT IN AERS: 2.0000 | INHALATION_SPRAY | Freq: Once | RESPIRATORY_TRACT | Status: DC

## 2019-10-02 MED ORDER — MONTELUKAST SODIUM 10 MG PO TABS
10.0000 mg | ORAL_TABLET | Freq: Every day | ORAL | Status: DC
  Administered 2019-10-02 – 2019-10-08 (×7): 10 mg via ORAL
  Filled 2019-10-02 (×7): qty 1

## 2019-10-02 MED ORDER — HYDROCOD POLST-CPM POLST ER 10-8 MG/5ML PO SUER
5.0000 mL | Freq: Two times a day (BID) | ORAL | Status: DC | PRN
  Administered 2019-10-04: 5 mL via ORAL
  Filled 2019-10-02: qty 5

## 2019-10-02 MED ORDER — FLUOXETINE HCL 20 MG PO CAPS
20.0000 mg | ORAL_CAPSULE | Freq: Every day | ORAL | Status: DC
  Administered 2019-10-02 – 2019-10-07 (×6): 20 mg via ORAL
  Filled 2019-10-02 (×4): qty 1
  Filled 2019-10-02: qty 2
  Filled 2019-10-02: qty 1

## 2019-10-02 MED ORDER — FLUTICASONE PROPIONATE 50 MCG/ACT NA SUSP
2.0000 | Freq: Every day | NASAL | Status: DC
  Administered 2019-10-02 – 2019-10-08 (×7): 2 via NASAL
  Filled 2019-10-02 (×2): qty 16

## 2019-10-02 MED ORDER — NICOTINE 14 MG/24HR TD PT24
14.0000 mg | MEDICATED_PATCH | Freq: Every day | TRANSDERMAL | Status: DC
  Administered 2019-10-02: 17:00:00 14 mg via TRANSDERMAL
  Filled 2019-10-02: qty 1

## 2019-10-02 MED ORDER — SODIUM CHLORIDE 0.9 % IV SOLN
250.0000 mL | INTRAVENOUS | Status: DC | PRN
  Administered 2019-10-02: 17:00:00 250 mL via INTRAVENOUS

## 2019-10-02 MED ORDER — FOLIC ACID 1 MG PO TABS
1.0000 mg | ORAL_TABLET | Freq: Every day | ORAL | Status: DC
  Administered 2019-10-02 – 2019-10-08 (×7): 1 mg via ORAL
  Filled 2019-10-02 (×7): qty 1

## 2019-10-02 MED ORDER — MOMETASONE FURO-FORMOTEROL FUM 100-5 MCG/ACT IN AERO
2.0000 | INHALATION_SPRAY | Freq: Two times a day (BID) | RESPIRATORY_TRACT | Status: DC
  Administered 2019-10-02 – 2019-10-08 (×10): 2 via RESPIRATORY_TRACT
  Filled 2019-10-02 (×3): qty 8.8

## 2019-10-02 MED ORDER — METHYLPREDNISOLONE SODIUM SUCC 125 MG IJ SOLR
125.0000 mg | Freq: Once | INTRAMUSCULAR | Status: AC
  Administered 2019-10-02: 125 mg via INTRAVENOUS
  Filled 2019-10-02: qty 2

## 2019-10-02 MED ORDER — LORATADINE 10 MG PO TABS
10.0000 mg | ORAL_TABLET | Freq: Every day | ORAL | Status: DC
  Administered 2019-10-02 – 2019-10-08 (×7): 10 mg via ORAL
  Filled 2019-10-02 (×7): qty 1

## 2019-10-02 MED ORDER — POLYETHYLENE GLYCOL 3350 17 G PO PACK: 17.0000 g | PACK | Freq: Every day | ORAL | Status: DC | PRN

## 2019-10-02 MED ORDER — SODIUM CHLORIDE 0.9% FLUSH: 3.0000 mL | INTRAVENOUS | Status: DC | PRN

## 2019-10-02 MED ORDER — OXYCODONE HCL 5 MG PO TABS
10.0000 mg | ORAL_TABLET | Freq: Four times a day (QID) | ORAL | Status: DC | PRN
  Administered 2019-10-03 – 2019-10-08 (×12): 10 mg via ORAL
  Filled 2019-10-02 (×12): qty 2

## 2019-10-02 MED ORDER — ASCORBIC ACID 500 MG PO TABS
500.0000 mg | ORAL_TABLET | Freq: Every day | ORAL | Status: DC
  Administered 2019-10-02 – 2019-10-08 (×7): 500 mg via ORAL
  Filled 2019-10-02 (×7): qty 1

## 2019-10-02 MED ORDER — INSULIN ASPART 100 UNIT/ML ~~LOC~~ SOLN: 0.0000 [IU] | Freq: Every day | SUBCUTANEOUS | Status: DC

## 2019-10-02 MED ORDER — ACETAMINOPHEN 325 MG PO TABS
650.0000 mg | ORAL_TABLET | Freq: Four times a day (QID) | ORAL | Status: DC | PRN
  Administered 2019-10-02: 650 mg via ORAL
  Filled 2019-10-02: qty 2

## 2019-10-02 MED ORDER — ONDANSETRON HCL 4 MG PO TABS: 4.0000 mg | ORAL_TABLET | Freq: Four times a day (QID) | ORAL | Status: DC | PRN

## 2019-10-02 MED ORDER — AZITHROMYCIN 250 MG PO TABS
500.0000 mg | ORAL_TABLET | Freq: Every day | ORAL | Status: AC
  Administered 2019-10-02 – 2019-10-04 (×3): 500 mg via ORAL
  Filled 2019-10-02 (×3): qty 2

## 2019-10-02 MED ORDER — ENOXAPARIN SODIUM 40 MG/0.4ML ~~LOC~~ SOLN
40.0000 mg | SUBCUTANEOUS | Status: DC
  Administered 2019-10-02 – 2019-10-07 (×6): 40 mg via SUBCUTANEOUS
  Filled 2019-10-02 (×7): qty 0.4

## 2019-10-02 MED ORDER — GUAIFENESIN ER 600 MG PO TB12
600.0000 mg | ORAL_TABLET | Freq: Two times a day (BID) | ORAL | Status: DC
  Administered 2019-10-02 – 2019-10-08 (×12): 600 mg via ORAL
  Filled 2019-10-02 (×14): qty 1

## 2019-10-02 MED ORDER — UMECLIDINIUM BROMIDE 62.5 MCG/INH IN AEPB
1.0000 | INHALATION_SPRAY | Freq: Every day | RESPIRATORY_TRACT | Status: DC
  Filled 2019-10-02: qty 7

## 2019-10-02 MED ORDER — TIOTROPIUM BROMIDE MONOHYDRATE 18 MCG IN CAPS: 18.0000 ug | ORAL_CAPSULE | Freq: Every day | RESPIRATORY_TRACT | Status: DC

## 2019-10-02 MED ORDER — INSULIN ASPART 100 UNIT/ML ~~LOC~~ SOLN
0.0000 [IU] | Freq: Three times a day (TID) | SUBCUTANEOUS | Status: DC
  Administered 2019-10-03 – 2019-10-07 (×7): 1 [IU] via SUBCUTANEOUS
  Administered 2019-10-07: 17:00:00 2 [IU] via SUBCUTANEOUS
  Administered 2019-10-08: 13:00:00 1 [IU] via SUBCUTANEOUS

## 2019-10-02 MED ORDER — ADULT MULTIVITAMIN W/MINERALS CH
1.0000 | ORAL_TABLET | Freq: Every day | ORAL | Status: DC
  Administered 2019-10-02 – 2019-10-08 (×7): 1 via ORAL
  Filled 2019-10-02 (×7): qty 1

## 2019-10-02 NOTE — ED Notes (Signed)
CRITICAL VALUE ALERT  Critical Value:  COVID +  Date & Time Notied:  10/02/2019 @ 0828  Provider Notified: Dr Carley Hammed and Harolyn Rutherford, PA  Orders Received/Actions taken: see new orders

## 2019-10-02 NOTE — ED Provider Notes (Signed)
[facility] EMERGENCY DEPARTMENT Provider Note   CSN: [id number] Arrival date & time: 10/02/19  0754     History Chief Complaint  Patient presents with  . Cough    Rebecca Hansen [place] is Hansen 69 y.o. female.  HPI      Rebecca Hansen is Hansen 69 y.o. female, with Hansen history of COPD, presenting to the ED with cough and shortness of breath beginning around December 31.  Accompanied by nasal congestion, fatigue, headaches, chills, and generalized body aches. She typically does not need to use her albuterol inhalers, however, over the last couple days she has had to use them multiple times Hansen day.  She has supplemental oxygen at home to be used as needed.  She typically only has to use it once every few weeks, but over the last couple days has been using it almost constantly. Patient denies contact with known or suspected COVID-19 patients. Denies chest pain, abdominal pain, urinary symptoms, N/V/D, syncope, lower extremity edema/pain, or any other complaints.    Note: Patient states her PCP has prescribed her Hansen few medications for her symptoms yesterday.  She states one of these medications was an antibiotic, but she cannot remember the name. She does state, "I've been taking it every 2-4 hours like he said I should."  Past Medical History:  Diagnosis Date  . COPD (chronic obstructive pulmonary disease) [facility])     Patient Active Problem List   Diagnosis Date Noted  . Acute respiratory disease due to COVID-19 virus 10/02/2019  . Pneumonia due to COVID-19 virus 10/02/2019  . Acute respiratory failure with hypoxia (HCC) 06/21/2015  . Hyperglycemia, drug-induced 06/20/2015  . Abnormal thyroid function test 06/20/2015  . Tobacco abuse 06/19/2015  . Back pain 06/19/2015  . Anxiety 06/19/2015  . COPD exacerbation (HCC) 06/19/2015  . COPD with acute exacerbation (HCC) 06/18/2015    Past Surgical History:  Procedure Laterality Date  . HYSTEROSCOPY       OB History   No obstetric history on  file.     No family history on file.  Social History   Tobacco Use  . Smoking status: Former Smoker    Packs/day: 0.50    Types: Cigarettes  . Smokeless tobacco: Never Used  Substance Use Topics  . Alcohol use: No  . Drug use: Never    Home Medications Prior to Admission medications   Medication Sig Start Date End Date Taking? Authorizing Provider  albuterol (PROVENTIL HFA;VENTOLIN HFA) 108 (90 BASE) MCG/ACT inhaler Inhale 2 puffs into the lungs every 4 (four) hours as needed for wheezing or shortness of breath. 06/23/15  Yes Philip Aspen, Limmie Patricia, MD  albuterol (PROVENTIL) (2.5 MG/3ML) 0.083% nebulizer solution Take 3 mLs (2.5 mg total) by nebulization every 2 (two) hours as needed for wheezing or shortness of breath. 06/23/15  Yes Philip Aspen, Limmie Patricia, MD  ALPRAZolam Prudy Feeler) 1 MG tablet Take 1 mg by mouth every morning. and one at lunch and 2 at bedtime. 06/06/15  Yes [provider]  cetirizine (ZYRTEC) 10 MG tablet Take 10 mg by mouth daily. 04/21/19  Yes [provider]  FLUoxetine (PROZAC) 20 MG capsule TK 1 C PO ONCE D FOR DEPRESSION 11/20/15  Yes [provider]  fluticasone (FLONASE) 50 MCG/ACT nasal spray Place 2 sprays into both nostrils daily. 08/31/19  Yes [provider]  montelukast (SINGULAIR) 10 MG tablet Take 10 mg by mouth daily. 08/31/19  Yes [provider]  oxyCODONE-acetaminophen (PERCOCET) 10-325 MG  tablet Take 1 tablet by mouth 3 (three) times daily as needed. 09/15/19  Yes [provider]  tiotropium (SPIRIVA HANDIHALER) 18 MCG inhalation capsule Place 1 capsule (18 mcg total) into inhaler and inhale daily. 06/23/15  Yes Philip Aspen, Limmie Patricia, MD  triamcinolone ointment (KENALOG) 0.5 % Apply 1 application topically 3 (three) times daily. 09/28/19  Yes [provider]  Monte Fantasia INHUB 250-50 MCG/DOSE AEPB 1 puff 2 (two) times daily. 08/31/19  Yes [provider]  budesonide-formoterol  (SYMBICORT) 80-4.5 MCG/ACT inhaler Inhale 2 puffs into the lungs 2 (two) times daily. Patient not taking: Reported on 10/02/2019 06/23/15   Philip Aspen, Limmie Patricia, MD  HYDROcodone-acetaminophen [facility]) 10-325 MG per tablet Take 1 tablet by mouth every 8 (eight) hours as needed. 05/19/15   [provider]  levofloxacin (LEVAQUIN) 750 MG tablet Take 1 tablet (750 mg total) by mouth daily. Patient not taking: Reported on 10/02/2019 01/03/16   Philip Aspen, Limmie Patricia, MD  predniSONE (DELTASONE) 10 MG tablet Take 1 tablet (10 mg total) by mouth daily with breakfast. Take 6 tablets today and then decrease by 1 tablet daily until none are left. Patient not taking: Reported on 10/02/2019 01/03/16   Philip Aspen, Limmie Patricia, MD    Allergies    Patient has no known allergies.  Review of Systems   Review of Systems  Constitutional: Positive for chills and fatigue.  HENT: Positive for congestion and sinus pain.   Respiratory: Positive for cough and shortness of breath.   Cardiovascular: Negative for chest pain and leg swelling.  Gastrointestinal: Negative for abdominal pain, diarrhea, nausea and vomiting.  Genitourinary: Negative for dysuria, flank pain, frequency and hematuria.  Musculoskeletal: Positive for myalgias.  Neurological: Negative for dizziness and syncope.  All other systems reviewed and are negative.   Physical Exam Updated Vital Signs BP 140/75 (BP Location: Left Arm)   Pulse (!) 116   Temp 99.9 F (37.7 C) (Oral)   Resp 20   SpO2 90%   Physical Exam Vitals and nursing note reviewed.  Constitutional:      General: She is in acute distress (respiratory).     Appearance: She is well-developed. She is not diaphoretic.  HENT:     Head: Normocephalic and atraumatic.     Mouth/Throat:     Mouth: Mucous membranes are moist.     Pharynx: Oropharynx is clear.  Eyes:     Conjunctiva/sclera: Conjunctivae normal.  Cardiovascular:     Rate and Rhythm: Regular rhythm.  Tachycardia present.     Pulses: Normal pulses.          Radial pulses are 2+ on the right side and 2+ on the left side.       Posterior tibial pulses are 2+ on the right side and 2+ on the left side.     Heart sounds: Normal heart sounds.     Comments: Tactile temperature in the extremities appropriate and equal bilaterally. Mild tachycardia initially, but this seemed to resolve after the patient rested. Pulmonary:     Effort: Respiratory distress present.     Breath sounds: Normal breath sounds.     Comments: Some conversational and exertional dyspnea. SPO2 noted to drop to 86 to 87% on room air with speaking and exertion. Abdominal:     Palpations: Abdomen is soft.     Tenderness: There is no abdominal tenderness. There is no guarding.  Musculoskeletal:     Cervical back: Neck supple.     Right  lower leg: No edema.     Left lower leg: No edema.  Lymphadenopathy:     Cervical: No cervical adenopathy.  Skin:    General: Skin is warm and dry.  Neurological:     Mental Status: She is alert.  Psychiatric:        Mood and Affect: Mood and affect normal.        Speech: Speech normal.        Behavior: Behavior normal.     ED Results / Procedures / Treatments   Labs (all labs ordered are listed, but only abnormal results are displayed) Labs Reviewed  COMPREHENSIVE METABOLIC PANEL - Abnormal; Notable for the following components:      Result Value   Sodium 132 (*)    Potassium 3.4 (*)    Chloride 97 (*)    Glucose, Bld 107 (*)    Calcium 8.2 (*)    Albumin 3.2 (*)    All other components within normal limits  CBC WITH DIFFERENTIAL/PLATELET - Abnormal; Notable for the following components:   RBC 3.73 (*)    Hemoglobin 11.4 (*)    HCT 34.9 (*)    Platelets 133 (*)    All other components within normal limits  BLOOD GAS, VENOUS - Abnormal; Notable for the following components:   pH, Ven 7.466 (*)    pCO2, Ven 36.8 (*)    pO2, Ven 52.3 (*)    Acid-Base Excess 2.7 (*)     All other components within normal limits  D-DIMER, QUANTITATIVE (NOT AT [facility]) - Abnormal; Notable for the following components:   D-Dimer, Quant 1.43 (*)    All other components within normal limits  FIBRINOGEN - Abnormal; Notable for the following components:   Fibrinogen 712 (*)    All other components within normal limits  C-REACTIVE PROTEIN - Abnormal; Notable for the following components:   CRP 21.0 (*)    All other components within normal limits  POC SARS CORONAVIRUS 2 AG -  ED - Abnormal; Notable for the following components:   SARS Coronavirus 2 Ag POSITIVE (*)    All other components within normal limits  CULTURE, BLOOD (ROUTINE X 2)  CULTURE, BLOOD (ROUTINE X 2)  LACTIC ACID, PLASMA  LACTIC ACID, PLASMA  PROCALCITONIN  LACTATE DEHYDROGENASE  FERRITIN  TRIGLYCERIDES  URINALYSIS, ROUTINE W REFLEX MICROSCOPIC    EKG EKG Interpretation  Date/Time:  Saturday October 02 2019 08:28:51 EST Ventricular Rate:  102 PR Interval:    QRS Duration: 82 QT Interval:  329 QTC Calculation: 429 R Axis:   62 Text Interpretation: Sinus tachycardia Confirmed by Davonna Belling [phone]) on 10/02/2019 10:03:37 AM   Radiology DG Chest Portable 1 View  Result Date: 10/02/2019 CLINICAL DATA:  Shortness of breath. EXAM: PORTABLE CHEST 1 VIEW COMPARISON:  January 02, 2016. FINDINGS: The heart size and mediastinal contours are within normal limits. No pneumothorax or pleural effusion is noted. Mild bibasilar subsegmental atelectasis or infiltrates are noted. The visualized skeletal structures are unremarkable. IMPRESSION: Mild bibasilar subsegmental atelectasis or infiltrates are noted. Electronically Signed   By: Marijo Conception M.D.   On: 10/02/2019 09:17    Procedures Procedures (including critical care time)  Medications Ordered in ED Medications  ALPRAZolam (XANAX) tablet 1 mg (has no administration in time range)  Ipratropium-Albuterol (COMBIVENT) respimat 4 puff (4 puffs Inhalation  Given 10/02/19 1032)    ED Course  I have reviewed the triage vital signs and the nursing notes.  Pertinent labs &  imaging results that were available during my care of the patient were reviewed by me and considered in my medical decision making (see chart for details).  Clinical Course as of Oct 01 1222  Sat Oct 02, 2019  1205 Spoke with Dr. Mariea Clonts, hospitalist.  Agrees to admit the patient.   [SJ]    Clinical Course User Index [SJ] Huxton Glaus, Hillard Danker, PA-C   MDM Rules/Calculators/Hansen&P                       Patient presents for shortness of breath and cough.  Nontoxic-appearing, however, hypoxic with desaturations into the mid 80s on room air while at rest, some readings noted into the 70s with exertion.  Maintaining mid to high 90s on 2 L supplemental O2. Suspected infiltrates on chest x-ray. Admitted for further management.  Findings and plan of care discussed with Carmell Austria, MD.    Vitals:   10/02/19 1100 10/02/19 1115 10/02/19 1121 10/02/19 1130  BP: (!) 124/55   (!) 135/56  Pulse: 90 98 91   Resp: (!) 30 18 (!) 35 (!) 21  Temp:      TempSrc:      SpO2: 100% 94% 100%      Denim Hansen Helget was evaluated in Emergency Department on 10/02/2019 for the symptoms described in the history of present illness. She was evaluated in the context of the global COVID-19 pandemic, which necessitated consideration that the patient might be at risk for infection with the SARS-CoV-2 virus that causes COVID-19. Institutional protocols and algorithms that pertain to the evaluation of patients at risk for COVID-19 are in Hansen state of rapid change based on information released by regulatory bodies including the CDC and federal and state organizations. These policies and algorithms were followed during the patient's care in the ED.  Final Clinical Impression(s) / ED Diagnoses Final diagnoses:  COVID-19  Shortness of breath  Hypoxia    Rx / DC Orders ED Discharge Orders    None       Concepcion Living 10/02/19 1225    Benjiman Core, MD 10/02/19 1623

## 2019-10-02 NOTE — Progress Notes (Signed)
Pt stated she was very hungry and began eating dinner when tray was delivered. Ate approx 25% and then began to complain of nausea. Zofran admin'd per order.  Pt states headache is better since po Tylenol, rates 5/10 at present.

## 2019-10-02 NOTE — H&P (Signed)
Patient Demographics:    Rebecca Hansen, is a 69 y.o. female  MRN: [id number]   DOB - [birth year]  Admit Date - 10/02/2019  Outpatient Primary MD for the patient is [name], MD   Assessment & Plan:    Principal Problem:   Pneumonia due to COVID-19 virus Active Problems:   Acute respiratory disease due to COVID-19 virus   Acute on chronic respiratory failure with hypoxia due to Covid pneumonia   Anxiety   COPD (chronic obstructive pulmonary disease) (HCC)    1)Acute on chronic hypoxic respiratory failure secondary to COVID-19 infection/pneumonia--- The treatment plan and use of medications  for treatment of COVID-19 infection and possible side effects were discussed with patient/daughter,  explained that there is No proven definitive treatment for COVID-19 infection, any medications used here are based on published clinical articles/anecdotal data which at times and not yet peer-reviewed or randomized control trials. Complete risks and long-term side effects are unknown, however in the best clinical judgment they seem to be of some clinical benefit . --potential side effects of Remdesivir including, but not limited to allergic reaction, nausea, vomiting, elevated LFTs discussed with patient Greta Doom discussed potential steroid side effect including elevated blood sugars, elevated blood pressure, psychosis/anxiety,  insomnia --Patient/daughter verbalizes understanding and agrees to treatment protocols   --Patient is positive for COVID-19 infection, chest x-ray with findings of infiltrates/opacities,  patient is hypoxic and requiring continuous supplemental oxygen---patient meets criteria for initiation of Remdesivir AND Steroid therapy per protocol  -Elevated inflammatory markers noted including CRP of 21,  fibrinogen 712, D-dimer 1.43 --Check and trend fibrinogen, CRP, pro calcitonin, CBC, BMP, d-dimer, LDH, ferritin and LFTs --Supplemental oxygen to keep O2 sats above 93% -Follow serial chest x-rays and ABGs as indicated --- Encourage prone positioning for More than 16 hours/day in increments of 2 to 3 hours at a time if able to tolerate --Attempt to maintain euvolemic state --Zinc and vitamin C as ordered -Albuterol inhaler as needed  2) bilateral pneumonia in a patient with COPD with elevated procalcitonin--- in the setting of COVID-19 infection -Empirically treat with Rocephin and azithromycin for possible concomitant bacterial infection, -Follow-up procalcitonin  3)Social/Ethics--- Additional history obtained from patient's daughter Ms. Thelma Comp ([phone]) -Plan of care and CODE STATUS discussed, patient is a full code  4)COPD---  bronchodilators as ordered, steroids as above #1  5)Anxiety/insomnia--- give trazodone nightly, continue Prozac, Xanax as needed  6) chronic pain syndrome--- be judicious with opiates given concomitant benzo use as well as respiratory distress and worsening hypoxia resulting from Covid pneumonia  With History of - Reviewed by me  Past Medical History:  Diagnosis Date  . COPD (chronic obstructive pulmonary disease) (HCC)       Past Surgical History:  Procedure Laterality Date  . HYSTEROSCOPY      Chief Complaint  Patient presents with  . Cough      HPI:    Rebecca Hansen  is a  69 y.o. female with past medical history relevant for COPD, obesity, anxiety disorder and chronic pain syndrome  who presents with complaints of cough and shortness of breath, headaches, fatigue, chills and myalgias as well as nasal congestion since 09/29/2019. -Patient had subjective fevers at home but no fevers here --No known sick contacts -Patient has been using bronchodilators at home with no relief -Typically she uses home O2 as needed but lately she is  having to use it more  No Nausea, Vomiting or Diarrhea  --Additional history obtained from patient's daughter Ms. Thelma Comp ([phone])  --In the ED she is found to be hypoxic down to 84% despite having O2 -- COVID-19 test is positive -Chest x-ray with bilateral pneumonia -Lactic acid is 1.2, triglycerides 112, ferritin 154, LDH 142, procalcitonin 2.1, WBCs 8.1, LFTs are not elevated -Elevated inflammatory markers noted including CRP of 21, fibrinogen 712, D-dimer 1.43   Review of systems:    In addition to the HPI above,   A full Review of  Systems was done, all other systems reviewed are negative except as noted above in HPI , .    Social History:  Reviewed by me    Social History   Tobacco Use  . Smoking status: Former Smoker    Packs/day: 0.50    Types: Cigarettes  . Smokeless tobacco: Never Used  Substance Use Topics  . Alcohol use: No     Family History :  Reviewed by me  HTN   Home Medications:   Prior to Admission medications   Medication Sig Start Date End Date Taking? Authorizing Provider  albuterol (PROVENTIL HFA;VENTOLIN HFA) 108 (90 BASE) MCG/ACT inhaler Inhale 2 puffs into the lungs every 4 (four) hours as needed for wheezing or shortness of breath. 06/23/15  Yes Philip Aspen, Limmie Patricia, MD  albuterol (PROVENTIL) (2.5 MG/3ML) 0.083% nebulizer solution Take 3 mLs (2.5 mg total) by nebulization every 2 (two) hours as needed for wheezing or shortness of breath. 06/23/15  Yes Philip Aspen, Limmie Patricia, MD  ALPRAZolam Prudy Feeler) 1 MG tablet Take 1 mg by mouth every morning. and one at lunch and 2 at bedtime. 06/06/15  Yes [provider]  cetirizine (ZYRTEC) 10 MG tablet Take 10 mg by mouth daily. 04/21/19  Yes [provider]  FLUoxetine (PROZAC) 20 MG capsule TK 1 C PO ONCE D FOR DEPRESSION 11/20/15  Yes [provider]  fluticasone (FLONASE) 50 MCG/ACT nasal spray Place 2 sprays into both nostrils daily. 08/31/19  Yes  [provider]  montelukast (SINGULAIR) 10 MG tablet Take 10 mg by mouth daily. 08/31/19  Yes [provider]  oxyCODONE-acetaminophen (PERCOCET) 10-325 MG tablet Take 1 tablet by mouth 3 (three) times daily as needed. 09/15/19  Yes [provider]  tiotropium (SPIRIVA HANDIHALER) 18 MCG inhalation capsule Place 1 capsule (18 mcg total) into inhaler and inhale daily. 06/23/15  Yes Philip Aspen, Limmie Patricia, MD  triamcinolone ointment (KENALOG) 0.5 % Apply 1 application topically 3 (three) times daily. 09/28/19  Yes [provider]  Monte Fantasia INHUB 250-50 MCG/DOSE AEPB 1 puff 2 (two) times daily. 08/31/19  Yes [provider]  budesonide-formoterol (SYMBICORT) 80-4.5 MCG/ACT inhaler Inhale 2 puffs into the lungs 2 (two) times daily. Patient not taking: Reported on 10/02/2019 06/23/15   Philip Aspen, Limmie Patricia, MD  HYDROcodone-acetaminophen [facility]) 10-325 MG per tablet Take 1 tablet by mouth every 8 (eight) hours as needed. 05/19/15   [provider]  levofloxacin (LEVAQUIN) 750 MG tablet Take  1 tablet (750 mg total) by mouth daily. Patient not taking: Reported on 10/02/2019 01/03/16   Isaac Bliss, Rayford Halsted, MD  predniSONE (DELTASONE) 10 MG tablet Take 1 tablet (10 mg total) by mouth daily with breakfast. Take 6 tablets today and then decrease by 1 tablet daily until none are left. Patient not taking: Reported on 10/02/2019 01/03/16   Isaac Bliss, Rayford Halsted, MD     Allergies:    No Known Allergies   Physical Exam:   Vitals  Blood pressure (!) 135/56, pulse 91, temperature 99.3 F (37.4 C), resp. rate (!) 21, SpO2 100 %. Temp:  [99.3 F (37.4 C)-99.9 F (37.7 C)] 99.3 F (37.4 C) (01/02 1244) Pulse Rate:  [88-116] 91 (01/02 1121) Resp:  [18-35] 21 (01/02 1130) BP: (96-140)/(52-75) 135/56 (01/02 1130) SpO2:  [90 %-100 %] 100 % (01/02 1121)   Physical Examination: General appearance - alert,   in no distress , +ve dyspnea on  exertion, Mental status - alert, oriented to person, place, and time,  Eyes - sclera anicteric Nose- [place] 3 L/min Neck - supple, no JVD elevation , Chest -diminished in bases, scattered rhonchi and wheezes  heart - S1 and S2 normal, regular Abdomen - soft, nontender, nondistended, no masses or organomegaly Neurological - screening mental status exam normal, neck supple without rigidity, cranial nerves II through XII intact, DTR's normal and symmetric Extremities - no pedal edema noted, intact peripheral pulses  Skin - warm, dry     Data Review:    CBC Recent Labs  Lab 10/02/19 0820  WBC 8.1  HGB 11.4*  HCT 34.9*  PLT 133*  MCV 93.6  MCH 30.6  MCHC 32.7  RDW 14.1  LYMPHSABS 0.8  MONOABS 0.5  EOSABS 0.0  BASOSABS 0.0   ------------------------------------------------------------------------------------------------------------------  Chemistries  Recent Labs  Lab 10/02/19 0820  NA 132*  K 3.4*  CL 97*  CO2 25  GLUCOSE 107*  BUN 11  CREATININE 0.71  CALCIUM 8.2*  AST 26  ALT 19  ALKPHOS 109  BILITOT 0.5   ------------------------------------------------------------------------------------------------------------------ CrCl cannot be calculated (Unknown ideal weight.). ------------------------------------------------------------------------------------------------------------------ No results for input(s): TSH, T4TOTAL, T3FREE, THYROIDAB in the last 72 hours.  Invalid input(s): FREET3   Coagulation profile No results for input(s): INR, PROTIME in the last 168 hours. ------------------------------------------------------------------------------------------------------------------- Recent Labs    10/02/19 0850  DDIMER 1.43*   -------------------------------------------------------------------------------------------------------------------  Cardiac Enzymes No results for input(s): CKMB, TROPONINI, MYOGLOBIN in the last 168 hours.  Invalid input(s):  CK ------------------------------------------------------------------------------------------------------------------ No results found for: BNP   ---------------------------------------------------------------------------------------------------------------  Urinalysis No results found for: COLORURINE, APPEARANCEUR, LABSPEC, PHURINE, GLUCOSEU, HGBUR, BILIRUBINUR, KETONESUR, PROTEINUR, UROBILINOGEN, NITRITE, LEUKOCYTESUR  ----------------------------------------------------------------------------------------------------------------   Imaging Results:    DG Chest Portable 1 View  Result Date: 10/02/2019 CLINICAL DATA:  Shortness of breath. EXAM: PORTABLE CHEST 1 VIEW COMPARISON:  January 02, 2016. FINDINGS: The heart size and mediastinal contours are within normal limits. No pneumothorax or pleural effusion is noted. Mild bibasilar subsegmental atelectasis or infiltrates are noted. The visualized skeletal structures are unremarkable. IMPRESSION: Mild bibasilar subsegmental atelectasis or infiltrates are noted. Electronically Signed   By: Marijo Conception M.D.   On: 10/02/2019 09:17    Radiological Exams on Admission: DG Chest Portable 1 View  Result Date: 10/02/2019 CLINICAL DATA:  Shortness of breath. EXAM: PORTABLE CHEST 1 VIEW COMPARISON:  January 02, 2016. FINDINGS: The heart size and mediastinal contours are within normal limits. No pneumothorax or pleural effusion is noted. Mild bibasilar subsegmental atelectasis  or infiltrates are noted. The visualized skeletal structures are unremarkable. IMPRESSION: Mild bibasilar subsegmental atelectasis or infiltrates are noted. Electronically Signed   By: Lupita Raider M.D.   On: 10/02/2019 09:17    DVT Prophylaxis -SCD /Lovenox AM Labs Ordered, also please review Full Orders  Family Communication: Admission, patients condition and plan of care including tests being ordered have been discussed with the patient and daughter Ms. Thelma Comp who  indicate understanding and agree with the plan   Code Status - Full Code  Likely DC to  TBD  Condition   stable  Shon Hale M.D on 10/02/2019 at 12:51 PM Go to [email or web address] -  for contact info  [facility] - Office  [phone]

## 2019-10-02 NOTE — ED Notes (Signed)
Pts pulse ox dropped to 84 while being ambulated. Pt is back on o2.

## 2019-10-02 NOTE — ED Triage Notes (Signed)
Pt reports cough, headache, and nasal congestion since Wednesday.

## 2019-10-03 DIAGNOSIS — J181 Lobar pneumonia, unspecified organism: Secondary | ICD-10-CM

## 2019-10-03 DIAGNOSIS — D696 Thrombocytopenia, unspecified: Secondary | ICD-10-CM

## 2019-10-03 DIAGNOSIS — G894 Chronic pain syndrome: Secondary | ICD-10-CM

## 2019-10-03 DIAGNOSIS — J069 Acute upper respiratory infection, unspecified: Secondary | ICD-10-CM

## 2019-10-03 DIAGNOSIS — J9621 Acute and chronic respiratory failure with hypoxia: Secondary | ICD-10-CM

## 2019-10-03 LAB — CBC WITH DIFFERENTIAL/PLATELET
Abs Immature Granulocytes: 0.06 10*3/uL (ref 0.00–0.07)
Basophils Absolute: 0 10*3/uL (ref 0.0–0.1)
Basophils Relative: 0 %
Eosinophils Absolute: 0 10*3/uL (ref 0.0–0.5)
Eosinophils Relative: 0 %
HCT: 41.7 % (ref 36.0–46.0)
Hemoglobin: 13.2 g/dL (ref 12.0–15.0)
Immature Granulocytes: 1 %
Lymphocytes Relative: 17 %
Lymphs Abs: 1.5 10*3/uL (ref 0.7–4.0)
MCH: 30.1 pg (ref 26.0–34.0)
MCHC: 31.7 g/dL (ref 30.0–36.0)
MCV: 95 fL (ref 80.0–100.0)
Monocytes Absolute: 0.5 10*3/uL (ref 0.1–1.0)
Monocytes Relative: 6 %
Neutro Abs: 6.6 10*3/uL (ref 1.7–7.7)
Neutrophils Relative %: 76 %
Platelets: 201 10*3/uL (ref 150–400)
RBC: 4.39 MIL/uL (ref 3.87–5.11)
RDW: 14 % (ref 11.5–15.5)
WBC: 8.6 10*3/uL (ref 4.0–10.5)
nRBC: 0 % (ref 0.0–0.2)

## 2019-10-03 LAB — COMPREHENSIVE METABOLIC PANEL
ALT: 22 U/L (ref 0–44)
AST: 31 U/L (ref 15–41)
Albumin: 3.4 g/dL — ABNORMAL LOW (ref 3.5–5.0)
Alkaline Phosphatase: 121 U/L (ref 38–126)
Anion gap: 14 (ref 5–15)
BUN: 16 mg/dL (ref 8–23)
CO2: 24 mmol/L (ref 22–32)
Calcium: 9.2 mg/dL (ref 8.9–10.3)
Chloride: 103 mmol/L (ref 98–111)
Creatinine, Ser: 0.9 mg/dL (ref 0.44–1.00)
GFR calc Af Amer: >60 mL/min (ref >60)
GFR calc non Af Amer: >60 mL/min (ref >60)
Glucose, Bld: 139 mg/dL — ABNORMAL HIGH (ref 70–99)
Potassium: 3.9 mmol/L (ref 3.5–5.1)
Sodium: 141 mmol/L (ref 135–145)
Total Bilirubin: 0.5 mg/dL (ref 0.3–1.2)
Total Protein: 8 g/dL (ref 6.5–8.1)

## 2019-10-03 LAB — D-DIMER, QUANTITATIVE: D-Dimer, Quant: 1.54 ug/mL-FEU — ABNORMAL HIGH (ref 0.00–0.50)

## 2019-10-03 LAB — PHOSPHORUS: Phosphorus: 5.7 mg/dL — ABNORMAL HIGH (ref 2.5–4.6)

## 2019-10-03 LAB — GLUCOSE, CAPILLARY
Glucose-Capillary: 121 mg/dL — ABNORMAL HIGH (ref 70–99)
Glucose-Capillary: 129 mg/dL — ABNORMAL HIGH (ref 70–99)
Glucose-Capillary: 130 mg/dL — ABNORMAL HIGH (ref 70–99)
Glucose-Capillary: 126 mg/dL — ABNORMAL HIGH (ref 70–99)

## 2019-10-03 LAB — C-REACTIVE PROTEIN: CRP: 23.9 mg/dL — ABNORMAL HIGH (ref <1.0)

## 2019-10-03 LAB — MAGNESIUM: Magnesium: 2.5 mg/dL — ABNORMAL HIGH (ref 1.7–2.4)

## 2019-10-03 LAB — FERRITIN: Ferritin: 314 ng/mL — ABNORMAL HIGH (ref 11–307)

## 2019-10-03 LAB — PROCALCITONIN: Procalcitonin: 3.34 ng/mL

## 2019-10-03 MED ORDER — MUSCLE RUB 10-15 % EX CREA
1.0000 "application " | TOPICAL_CREAM | CUTANEOUS | Status: DC | PRN
  Filled 2019-10-03: qty 85

## 2019-10-03 MED ORDER — ALUM & MAG HYDROXIDE-SIMETH 200-200-20 MG/5ML PO SUSP
30.0000 mL | ORAL | Status: DC | PRN
  Administered 2019-10-04 – 2019-10-05 (×6): 30 mL via ORAL
  Filled 2019-10-03 (×6): qty 30

## 2019-10-03 MED ORDER — IPRATROPIUM-ALBUTEROL 20-100 MCG/ACT IN AERS
1.0000 | INHALATION_SPRAY | Freq: Four times a day (QID) | RESPIRATORY_TRACT | Status: DC
  Administered 2019-10-04 – 2019-10-08 (×15): 1 via RESPIRATORY_TRACT
  Filled 2019-10-03 (×2): qty 4

## 2019-10-03 MED ORDER — PHENOL 1.4 % MT LIQD
1.0000 | OROMUCOSAL | Status: DC | PRN
  Administered 2019-10-04: 1 via OROMUCOSAL
  Filled 2019-10-03: qty 177

## 2019-10-03 MED ORDER — PROMETHAZINE HCL 25 MG/ML IJ SOLN
12.5000 mg | Freq: Four times a day (QID) | INTRAMUSCULAR | Status: DC | PRN
  Administered 2019-10-03: 20:00:00 12.5 mg via INTRAVENOUS
  Filled 2019-10-03: qty 1

## 2019-10-03 MED ORDER — SALINE SPRAY 0.65 % NA SOLN: 1.0000 | NASAL | Status: DC | PRN

## 2019-10-03 MED ORDER — POTASSIUM CHLORIDE IN NACL 20-0.9 MEQ/L-% IV SOLN: INTRAVENOUS | Status: AC

## 2019-10-03 MED ORDER — LORAZEPAM 2 MG/ML IJ SOLN
0.2500 mg | Freq: Once | INTRAMUSCULAR | Status: AC
  Administered 2019-10-03: 0.25 mg via INTRAVENOUS
  Filled 2019-10-03: qty 1

## 2019-10-03 MED ORDER — ONDANSETRON HCL 4 MG/2ML IJ SOLN
4.0000 mg | Freq: Four times a day (QID) | INTRAMUSCULAR | Status: AC
  Administered 2019-10-03 – 2019-10-04 (×4): 4 mg via INTRAVENOUS
  Filled 2019-10-03 (×4): qty 2

## 2019-10-03 MED ORDER — POLYVINYL ALCOHOL 1.4 % OP SOLN: 1.0000 [drp] | OPHTHALMIC | Status: DC | PRN

## 2019-10-03 MED ORDER — LIP MEDEX EX OINT
1.0000 "application " | TOPICAL_OINTMENT | CUTANEOUS | Status: DC | PRN
  Filled 2019-10-03: qty 7

## 2019-10-03 NOTE — Progress Notes (Signed)
Late Entry: upon entering patients room. Patient appeared to be very anxious. Gave patient prn xanax refer to [facility] for correct dose. Patient also had nausea and vomiting gave patient zofran refer to [facility] for correct dose. Patient at this time is resting in bed with eyes closed breathing is even and nonlabored. Will continue to monitor throughout shift.

## 2019-10-03 NOTE — Progress Notes (Signed)
PROGRESS NOTE  Rebecca Hansen:[id number] DOB: 1951/07/17 DOA: 10/02/2019 PCP: Lemmie Evens, MD  Brief History:  69 year old female with a history of COPD, chronic pain syndrome, anxiety/depression presenting with coughing, shortness of breath, myalgias, and fatigue since 09/29/2019.  The patient has been using her home bronchodilators without much improvement.  She has some subjective fevers and chills.  She denies any nausea, vomiting, diarrhea, abdominal pain, hemoptysis, hematochezia, melena. In the emergency department, the patient was found to be Covid positive.  Chest x-ray showed increasing interstitial markings and bibasilar opacities, left greater than right.  WBC 8.1, hemoglobin 11.4, platelets 133,000.  The patient was noted to have increased inflammatory markers.  She was started on IV steroids and remdesivir.  Assessment/Plan: Acute on chronic respiratory failure with hypoxia -Secondary to COVID-19 pneumonia -The patient states that she normally uses oxygen as needed 2-3 L -Presently stable on 3 L -CRP 21.0 -PCT 2.10>>3.34 -Ferritin 154 -D-dimer 1.43>>>1.54 -Lactic acid peaked 1.4 -Fibrinogen 712 -Continue remdesivir and steroids -CT angiogram chest given increasing D-dimer  COVID-19 pneumonia/lobar pneumonia -Continue ceftriaxone and azithromycin given elevation in procalcitonin -Urine Legionella antigen -Urine Streptococcus pneumonia antigen  COPD -Start Combivent -Continue Dulera  Chronic pain syndrome -PMP Aware queried -Patient receives monthly Percocet 10 mg, Xanax 1 mg, diazepam 5 mg, phentermine 37.5 mg  Thrombocytopenia -Secondary to infectious process -Monitor CBC  Cannabis abuse/recurrent vomiting -Suspect the patient may have cannabis hyperemesis syndrome -Start Zofran around-the-clock -Judicious IV fluids  Hypokalemia -Replete -Check magnesium  Anxiety/depression -Continue fluoxetine, home dose of alprazolam and  diazepam            Disposition Plan:   Home in 2-3 days  Family Communication:   Family at bedside  Consultants:  none  Code Status:  FULL  DVT Prophylaxis:   [place] Lovenox   Procedures: As Listed in Progress Note Above  Antibiotics: None       Subjective: Pt complains of n/v.  No diarrhea.  Still has dyspnea on exertion with cough. Denies f/c, hemoptysis, abd pain, headache  Objective: Vitals:   10/02/19 2105 10/02/19 2243 10/03/19 0454 10/03/19 0500  BP: 134/74  125/65   Pulse: 75  71   Resp: (!) 22 20 (!) 24 20  Temp: 98.6 F (37 C)  98.7 F (37.1 C)   TempSrc: Oral  Oral   SpO2: 96% 97% 99%   Weight:      Height:        Intake/Output Summary (Last 24 hours) at 10/03/2019 1044 Last data filed at 10/03/2019 0041 Gross per 24 hour  Intake 6 ml  Output 1 ml  Net 5 ml   Weight change:  Exam:   General:  Pt is alert, follows commands appropriately, not in acute distress  HEENT: No icterus, No thrush, No neck mass, [place] I/AT  Cardiovascular: RRR, S1/S2, no rubs, no gallops  Respiratory: bibasilar rales, no wheeze  Abdomen: Soft/+BS, non tender, non distended, no guarding  Extremities: No edema, No lymphangitis, No petechiae, No rashes, no synovitis   Data Reviewed: I have personally reviewed following labs and imaging studies Basic Metabolic Panel: Recent Labs  Lab 10/02/19 0820  NA 132*  K 3.4*  CL 97*  CO2 25  GLUCOSE 107*  BUN 11  CREATININE 0.71  CALCIUM 8.2*   Liver Function Tests: Recent Labs  Lab 10/02/19 0820  AST 26  ALT 19  ALKPHOS 109  BILITOT 0.5  PROT 6.9  ALBUMIN 3.2*   No results for input(s): LIPASE, AMYLASE in the last 168 hours. No results for input(s): AMMONIA in the last 168 hours. Coagulation Profile: No results for input(s): INR, PROTIME in the last 168 hours. CBC: Recent Labs  Lab 10/02/19 0820 10/03/19 0926  WBC 8.1 8.6  NEUTROABS 6.8 6.6  HGB 11.4* 13.2  HCT 34.9* 41.7  MCV 93.6 95.0  PLT  133* 201   Cardiac Enzymes: No results for input(s): CKTOTAL, CKMB, CKMBINDEX, TROPONINI in the last 168 hours. BNP: Invalid input(s): POCBNP CBG: Recent Labs  Lab 10/02/19 2102 10/03/19 0734  GLUCAP 160* 121*   HbA1C: No results for input(s): HGBA1C in the last 72 hours. Urine analysis: No results found for: COLORURINE, APPEARANCEUR, LABSPEC, PHURINE, GLUCOSEU, HGBUR, BILIRUBINUR, KETONESUR, PROTEINUR, UROBILINOGEN, NITRITE, LEUKOCYTESUR Sepsis Labs: @LABRCNTIP (procalcitonin:4,lacticidven:4) ) Recent Results (from the past 240 hour(s))  Blood Culture (routine x 2)     Status: None (Preliminary result)   Collection Time: 10/02/19  8:50 AM   Specimen: Left Antecubital; Blood  Result Value Ref Range Status   Specimen Description   Final    LEFT ANTECUBITAL BOTTLES DRAWN AEROBIC AND ANAEROBIC   Special Requests   Final    Blood Culture adequate volume Performed at [facility], [address], [place], [place]    Culture PENDING  Incomplete   Report Status PENDING  Incomplete  Blood Culture (routine x 2)     Status: None (Preliminary result)   Collection Time: 10/02/19  9:47 AM   Specimen: Right Antecubital; Blood  Result Value Ref Range Status   Specimen Description   Final    RIGHT ANTECUBITAL BOTTLES DRAWN AEROBIC AND ANAEROBIC   Special Requests   Final    Blood Culture adequate volume Performed at [facility], [address], [place], [place]    Culture PENDING  Incomplete   Report Status PENDING  Incomplete     Scheduled Meds: . vitamin C  500 mg Oral Daily  . azithromycin  500 mg Oral Daily  . enoxaparin (LOVENOX) injection  40 mg Subcutaneous Q24H  . FLUoxetine  20 mg Oral Daily  . fluticasone  2 spray Each Nare Daily  . folic acid  1 mg Oral Daily  . guaiFENesin  600 mg Oral BID  . insulin aspart  0-5 Units Subcutaneous QHS  . insulin aspart  0-9 Units Subcutaneous TID WC  . loratadine  10 mg Oral Daily  . methylPREDNISolone  (SOLU-MEDROL) injection  40 mg Intravenous Q12H  . mometasone-formoterol  2 puff Inhalation BID  . montelukast  10 mg Oral Daily  . multivitamin with minerals  1 tablet Oral Daily  . sodium chloride flush  3 mL Intravenous Q12H  . thiamine  100 mg Oral Daily  . traZODone  100 mg Oral QHS  . umeclidinium bromide  1 puff Inhalation Daily  . zinc sulfate  220 mg Oral Daily   Continuous Infusions: . sodium chloride 250 mL (10/02/19 1644)  . cefTRIAXone (ROCEPHIN)  IV 1 g (10/02/19 1647)  . remdesivir 100 mg in NS 100 mL 100 mg (10/03/19 0959)    Procedures/Studies: DG Chest Portable 1 View  Result Date: 10/02/2019 CLINICAL DATA:  Shortness of breath. EXAM: PORTABLE CHEST 1 VIEW COMPARISON:  January 02, 2016. FINDINGS: The heart size and mediastinal contours are within normal limits. No pneumothorax or pleural effusion is noted. Mild bibasilar subsegmental atelectasis or infiltrates are noted. The visualized skeletal structures are unremarkable. IMPRESSION: Mild bibasilar  subsegmental atelectasis or infiltrates are noted. Electronically Signed   By: Lupita Raider M.D.   On: 10/02/2019 09:17    Catarina Hartshorn, DO  [facility] Pager [phone]  If 7PM-7AM, please contact night-coverage [email or web address] Password [email or web address] 10/03/2019, 10:44 AM   LOS: 1 day

## 2019-10-04 ENCOUNTER — Inpatient Hospital Stay (HOSPITAL_COMMUNITY): Payer: Medicare Other

## 2019-10-04 DIAGNOSIS — R1110 Vomiting, unspecified: Secondary | ICD-10-CM

## 2019-10-04 DIAGNOSIS — R112 Nausea with vomiting, unspecified: Secondary | ICD-10-CM

## 2019-10-04 LAB — COMPREHENSIVE METABOLIC PANEL
ALT: 19 U/L (ref 0–44)
AST: 25 U/L (ref 15–41)
Albumin: 3 g/dL — ABNORMAL LOW (ref 3.5–5.0)
Alkaline Phosphatase: 89 U/L (ref 38–126)
Anion gap: 10 (ref 5–15)
BUN: 20 mg/dL (ref 8–23)
CO2: 26 mmol/L (ref 22–32)
Calcium: 8.6 mg/dL — ABNORMAL LOW (ref 8.9–10.3)
Chloride: 104 mmol/L (ref 98–111)
Creatinine, Ser: 0.72 mg/dL (ref 0.44–1.00)
GFR calc Af Amer: >60 mL/min (ref >60)
GFR calc non Af Amer: >60 mL/min (ref >60)
Glucose, Bld: 115 mg/dL — ABNORMAL HIGH (ref 70–99)
Potassium: 4 mmol/L (ref 3.5–5.1)
Sodium: 140 mmol/L (ref 135–145)
Total Bilirubin: 0.4 mg/dL (ref 0.3–1.2)
Total Protein: 6.8 g/dL (ref 6.5–8.1)

## 2019-10-04 LAB — URINALYSIS, COMPLETE (UACMP) WITH MICROSCOPIC
Bacteria, UA: NONE SEEN
Bilirubin Urine: NEGATIVE
Glucose, UA: NEGATIVE mg/dL
Hgb urine dipstick: NEGATIVE
Ketones, ur: NEGATIVE mg/dL
Leukocytes,Ua: NEGATIVE
Nitrite: NEGATIVE
Protein, ur: NEGATIVE mg/dL
Specific Gravity, Urine: 1.009 (ref 1.005–1.030)
pH: 7 (ref 5.0–8.0)

## 2019-10-04 LAB — RAPID URINE DRUG SCREEN, HOSP PERFORMED
Amphetamines: NOT DETECTED
Barbiturates: NOT DETECTED
Benzodiazepines: POSITIVE — AB
Cocaine: NOT DETECTED
Opiates: NOT DETECTED
Tetrahydrocannabinol: NOT DETECTED

## 2019-10-04 LAB — CBC WITH DIFFERENTIAL/PLATELET
Abs Immature Granulocytes: 0.15 10*3/uL — ABNORMAL HIGH (ref 0.00–0.07)
Basophils Absolute: 0 10*3/uL (ref 0.0–0.1)
Basophils Relative: 0 %
Eosinophils Absolute: 0 10*3/uL (ref 0.0–0.5)
Eosinophils Relative: 0 %
HCT: 36.5 % (ref 36.0–46.0)
Hemoglobin: 11.6 g/dL — ABNORMAL LOW (ref 12.0–15.0)
Immature Granulocytes: 1 %
Lymphocytes Relative: 13 %
Lymphs Abs: 1.6 10*3/uL (ref 0.7–4.0)
MCH: 30.1 pg (ref 26.0–34.0)
MCHC: 31.8 g/dL (ref 30.0–36.0)
MCV: 94.8 fL (ref 80.0–100.0)
Monocytes Absolute: 1.3 10*3/uL — ABNORMAL HIGH (ref 0.1–1.0)
Monocytes Relative: 10 %
Neutro Abs: 10 10*3/uL — ABNORMAL HIGH (ref 1.7–7.7)
Neutrophils Relative %: 76 %
Platelets: 203 10*3/uL (ref 150–400)
RBC: 3.85 MIL/uL — ABNORMAL LOW (ref 3.87–5.11)
RDW: 14.4 % (ref 11.5–15.5)
WBC: 13.1 10*3/uL — ABNORMAL HIGH (ref 4.0–10.5)
nRBC: 0 % (ref 0.0–0.2)

## 2019-10-04 LAB — PROCALCITONIN: Procalcitonin: 0.83 ng/mL

## 2019-10-04 LAB — GLUCOSE, CAPILLARY
Glucose-Capillary: 140 mg/dL — ABNORMAL HIGH (ref 70–99)
Glucose-Capillary: 111 mg/dL — ABNORMAL HIGH (ref 70–99)
Glucose-Capillary: 124 mg/dL — ABNORMAL HIGH (ref 70–99)
Glucose-Capillary: 109 mg/dL — ABNORMAL HIGH (ref 70–99)
Glucose-Capillary: 82 mg/dL (ref 70–99)

## 2019-10-04 LAB — MAGNESIUM: Magnesium: 2.3 mg/dL (ref 1.7–2.4)

## 2019-10-04 LAB — FERRITIN: Ferritin: 308 ng/mL — ABNORMAL HIGH (ref 11–307)

## 2019-10-04 LAB — D-DIMER, QUANTITATIVE: D-Dimer, Quant: 1.09 ug/mL-FEU — ABNORMAL HIGH (ref 0.00–0.50)

## 2019-10-04 LAB — PHOSPHORUS: Phosphorus: 3.8 mg/dL (ref 2.5–4.6)

## 2019-10-04 LAB — C-REACTIVE PROTEIN: CRP: 11.1 mg/dL — ABNORMAL HIGH (ref <1.0)

## 2019-10-04 LAB — LIPASE, BLOOD: Lipase: 82 U/L — ABNORMAL HIGH (ref 11–51)

## 2019-10-04 MED ORDER — SODIUM CHLORIDE 0.9 % IV SOLN
500.0000 mg | INTRAVENOUS | Status: DC
  Administered 2019-10-05 – 2019-10-06 (×2): 500 mg via INTRAVENOUS
  Filled 2019-10-04 (×2): qty 500

## 2019-10-04 MED ORDER — PANTOPRAZOLE SODIUM 40 MG IV SOLR
40.0000 mg | Freq: Two times a day (BID) | INTRAVENOUS | Status: DC
  Administered 2019-10-04 – 2019-10-08 (×8): 40 mg via INTRAVENOUS
  Filled 2019-10-04 (×9): qty 40

## 2019-10-04 MED ORDER — POTASSIUM CHLORIDE IN NACL 20-0.9 MEQ/L-% IV SOLN: INTRAVENOUS | Status: AC

## 2019-10-04 MED ORDER — IOHEXOL 300 MG/ML  SOLN
30.0000 mL | Freq: Once | INTRAMUSCULAR | Status: AC | PRN
  Administered 2019-10-04: 30 mL via ORAL

## 2019-10-04 MED ORDER — ONDANSETRON HCL 4 MG/2ML IJ SOLN
4.0000 mg | Freq: Four times a day (QID) | INTRAMUSCULAR | Status: DC
  Administered 2019-10-04 – 2019-10-08 (×14): 4 mg via INTRAVENOUS
  Filled 2019-10-04 (×12): qty 2

## 2019-10-04 MED ORDER — SODIUM CHLORIDE 0.9 % IV SOLN
1.0000 g | INTRAVENOUS | Status: DC
  Administered 2019-10-05 – 2019-10-06 (×2): 1 g via INTRAVENOUS
  Filled 2019-10-04 (×2): qty 10

## 2019-10-04 MED ORDER — IOHEXOL 300 MG/ML  SOLN
100.0000 mL | Freq: Once | INTRAMUSCULAR | Status: AC | PRN
  Administered 2019-10-04: 100 mL via INTRAVENOUS

## 2019-10-04 NOTE — Progress Notes (Signed)
PROGRESS NOTE  Rebecca Hansen BPZ:[id number] DOB: [birth year] DOA: 10/02/2019 PCP: [name], MD  Brief History:  69 year old female with a history of COPD, chronic pain syndrome, anxiety/depression presenting with coughing, shortness of breath, myalgias, and fatigue since 09/29/2019.  The patient has been using her home bronchodilators without much improvement.  She has some subjective fevers and chills.  She denies any nausea, vomiting, diarrhea, abdominal pain, hemoptysis, hematochezia, melena. In the emergency department, the patient was found to be Covid positive.  Chest x-ray showed increasing interstitial markings and bibasilar opacities, left greater than right.  WBC 8.1, hemoglobin 11.4, platelets 133,000.  The patient was noted to have increased inflammatory markers.  She was started on IV steroids and remdesivir.  Assessment/Plan: Acute on chronic respiratory failure with hypoxia -Secondary to COVID-19 pneumonia -The patient states that she normally uses oxygen as needed 2-3 L -Presently stable on 3 L -CRP 21.0>>>11.1 -PCT 2.10>>3.34>>0.83 -Ferritin 154>>>308 -D-dimer 1.43>>>1.54 -Lactic acid peaked 1.4 -Fibrinogen 712 -Continue remdesivir and steroids  COVID-19 pneumonia/lobar pneumonia -Continue ceftriaxone and azithromycin given elevation in procalcitonin -Urine Legionella antigen -Urine Streptococcus pneumonia antigen  Cannabis abuse/recurrent vomiting -Suspect the patient may have cannabis hyperemesis syndrome -contiunue Zofran around-the-clock -Judicious IV fluids -downgrade diet to clear liquids only -start protonix bid -lipase 82 -CT abd/pelvis  COPD -continue Combivent -Continue Dulera  Chronic pain syndrome -PMP Aware queried -Patient receives monthly Percocet 10 mg, Xanax 1 mg, diazepam 5 mg, phentermine 37.5 mg  Thrombocytopenia -Secondary to infectious process -Monitor CBC  Hypokalemia -Replete -Check magnesium  2.3  Anxiety/depression -Continue fluoxetine, home dose of alprazolam and diazepam            Disposition Plan:   Home in 2-3 days  Family Communication:   Family at bedside  Consultants:  none  Code Status:  FULL  DVT Prophylaxis:   [place] Lovenox   Procedures: As Listed in Progress Note Above  Antibiotics: None        Subjective: Pt continues to have n/v.  Complains of epigastric and abd pain.  She is passing flatus.  Denies f/c, diarrhea. Dysuria.  No hematemesis  Objective: Vitals:   10/03/19 2100 10/04/19 0501 10/04/19 0824 10/04/19 1429  BP: (!) 181/84 112/70    Pulse: 69 80    Resp: (!) 28 20    Temp: 97.9 F (36.6 C) 98.4 F (36.9 C)    TempSrc: Oral Oral    SpO2: 98% 95% 96% 95%  Weight:      Height:        Intake/Output Summary (Last 24 hours) at 10/04/2019 1456 Last data filed at 10/03/2019 1505 Gross per 24 hour  Intake 461.58 ml  Output --  Net 461.58 ml   Weight change:  Exam:   General:  Pt is alert, follows commands appropriately, not in acute distress  HEENT: No icterus, No thrush, No neck mass, [place]/AT  Cardiovascular: RRR, S1/S2, no rubs, no gallops  Respiratory: diminished breath sounds bilateral.  Bibasilar rales. No wheeze  Abdomen: Soft/+BS, epigastric/periumbilical tender, non distended, no guarding  Extremities: No edema, No lymphangitis, No petechiae, No rashes, no synovitis   Data Reviewed: I have personally reviewed following labs and imaging studies Basic Metabolic Panel: Recent Labs  Lab 10/02/19 0820 10/03/19 0926 10/04/19 0520  NA 132* 141 140  K 3.4* 3.9 4.0  CL 97* 103 104  CO2 25 24 26   GLUCOSE 107* 139* 115*  BUN 11 16 20  CREATININE 0.71 0.90 0.72  CALCIUM 8.2* 9.2 8.6*  MG  --  2.5* 2.3  PHOS  --  5.7* 3.8   Liver Function Tests: Recent Labs  Lab 10/02/19 0820 10/03/19 0926 10/04/19 0520  AST 26 31 25   ALT 19 22 19   ALKPHOS 109 121 89  BILITOT 0.5 0.5 0.4  PROT  6.9 8.0 6.8  ALBUMIN 3.2* 3.4* 3.0*   Recent Labs  Lab 10/04/19 1321  LIPASE 82*   No results for input(s): AMMONIA in the last 168 hours. Coagulation Profile: No results for input(s): INR, PROTIME in the last 168 hours. CBC: Recent Labs  Lab 10/02/19 0820 10/03/19 0926 10/04/19 0520  WBC 8.1 8.6 13.1*  NEUTROABS 6.8 6.6 10.0*  HGB 11.4* 13.2 11.6*  HCT 34.9* 41.7 36.5  MCV 93.6 95.0 94.8  PLT 133* 201 203   Cardiac Enzymes: No results for input(s): CKTOTAL, CKMB, CKMBINDEX, TROPONINI in the last 168 hours. BNP: Invalid input(s): POCBNP CBG: Recent Labs  Lab 10/03/19 1055 10/03/19 1603 10/03/19 2101 10/04/19 0758 10/04/19 1258  GLUCAP 129* 130* 126* 111* 124*   HbA1C: No results for input(s): HGBA1C in the last 72 hours. Urine analysis: No results found for: COLORURINE, APPEARANCEUR, LABSPEC, PHURINE, GLUCOSEU, HGBUR, BILIRUBINUR, KETONESUR, PROTEINUR, UROBILINOGEN, NITRITE, LEUKOCYTESUR Sepsis Labs: @LABRCNTIP (procalcitonin:4,lacticidven:4) ) Recent Results (from the past 240 hour(s))  Blood Culture (routine x 2)     Status: None (Preliminary result)   Collection Time: 10/02/19  8:50 AM   Specimen: Left Antecubital; Blood  Result Value Ref Range Status   Specimen Description   Final    LEFT ANTECUBITAL BOTTLES DRAWN AEROBIC AND ANAEROBIC   Special Requests Blood Culture adequate volume  Final   Culture   Final    NO GROWTH 2 DAYS Performed at [facility], [address] [place], [facility] [address]    Report Status PENDING  Incomplete  Blood Culture (routine x 2)     Status: None (Preliminary result)   Collection Time: 10/02/19  9:47 AM   Specimen: Right Antecubital; Blood  Result Value Ref Range Status   Specimen Description   Final    RIGHT ANTECUBITAL BOTTLES DRAWN AEROBIC AND ANAEROBIC   Special Requests Blood Culture adequate volume  Final   Culture   Final    NO GROWTH 2 DAYS Performed at [facility], [address], [place], [facility]  [address]    Report Status PENDING  Incomplete     Scheduled Meds: . vitamin C  500 mg Oral Daily  . enoxaparin (LOVENOX) injection  40 mg Subcutaneous Q24H  . FLUoxetine  20 mg Oral Daily  . fluticasone  2 spray Each Nare Daily  . folic acid  1 mg Oral Daily  . guaiFENesin  600 mg Oral BID  . insulin aspart  0-5 Units Subcutaneous QHS  . insulin aspart  0-9 Units Subcutaneous TID WC  . Ipratropium-Albuterol  1 puff Inhalation Q6H  . loratadine  10 mg Oral Daily  . methylPREDNISolone (SOLU-MEDROL) injection  40 mg Intravenous Q12H  . mometasone-formoterol  2 puff Inhalation BID  . montelukast  10 mg Oral Daily  . multivitamin with minerals  1 tablet Oral Daily  . sodium chloride flush  3 mL Intravenous Q12H  . thiamine  100 mg Oral Daily  . traZODone  100 mg Oral QHS  . zinc sulfate  220 mg Oral Daily   Continuous Infusions: . sodium chloride 250 mL (10/02/19 1644)  . remdesivir 100 mg in NS  100 mL 100 mg (10/04/19 0847)    Procedures/Studies: DG Chest Portable 1 View  Result Date: 10/02/2019 CLINICAL DATA:  Shortness of breath. EXAM: PORTABLE CHEST 1 VIEW COMPARISON:  January 02, 2016. FINDINGS: The heart size and mediastinal contours are within normal limits. No pneumothorax or pleural effusion is noted. Mild bibasilar subsegmental atelectasis or infiltrates are noted. The visualized skeletal structures are unremarkable. IMPRESSION: Mild bibasilar subsegmental atelectasis or infiltrates are noted. Electronically Signed   By: Marijo Conception M.D.   On: 10/02/2019 09:17    Orson Eva, DO  [facility] Pager [phone]  If 7PM-7AM, please contact night-coverage [email or web address] Password [email or web address] 10/04/2019, 2:56 PM   LOS: 2 days

## 2019-10-04 NOTE — Plan of Care (Signed)

## 2019-10-05 LAB — CBC WITH DIFFERENTIAL/PLATELET
Abs Immature Granulocytes: 0.16 10*3/uL — ABNORMAL HIGH (ref 0.00–0.07)
Basophils Absolute: 0 10*3/uL (ref 0.0–0.1)
Basophils Relative: 0 %
Eosinophils Absolute: 0 10*3/uL (ref 0.0–0.5)
Eosinophils Relative: 0 %
HCT: 41.1 % (ref 36.0–46.0)
Hemoglobin: 13 g/dL (ref 12.0–15.0)
Immature Granulocytes: 2 %
Lymphocytes Relative: 15 %
Lymphs Abs: 1.3 10*3/uL (ref 0.7–4.0)
MCH: 30.2 pg (ref 26.0–34.0)
MCHC: 31.6 g/dL (ref 30.0–36.0)
MCV: 95.4 fL (ref 80.0–100.0)
Monocytes Absolute: 1 10*3/uL (ref 0.1–1.0)
Monocytes Relative: 12 %
Neutro Abs: 5.9 10*3/uL (ref 1.7–7.7)
Neutrophils Relative %: 71 %
Platelets: 211 10*3/uL (ref 150–400)
RBC: 4.31 MIL/uL (ref 3.87–5.11)
RDW: 14.3 % (ref 11.5–15.5)
WBC: 8.4 10*3/uL (ref 4.0–10.5)
nRBC: 0 % (ref 0.0–0.2)

## 2019-10-05 LAB — COMPREHENSIVE METABOLIC PANEL
ALT: 19 U/L (ref 0–44)
AST: 22 U/L (ref 15–41)
Albumin: 3.2 g/dL — ABNORMAL LOW (ref 3.5–5.0)
Alkaline Phosphatase: 84 U/L (ref 38–126)
Anion gap: 9 (ref 5–15)
BUN: 17 mg/dL (ref 8–23)
CO2: 29 mmol/L (ref 22–32)
Calcium: 8.8 mg/dL — ABNORMAL LOW (ref 8.9–10.3)
Chloride: 103 mmol/L (ref 98–111)
Creatinine, Ser: 0.76 mg/dL (ref 0.44–1.00)
GFR calc Af Amer: >60 mL/min (ref >60)
GFR calc non Af Amer: >60 mL/min (ref >60)
Glucose, Bld: 112 mg/dL — ABNORMAL HIGH (ref 70–99)
Potassium: 4.9 mmol/L (ref 3.5–5.1)
Sodium: 141 mmol/L (ref 135–145)
Total Bilirubin: 0.4 mg/dL (ref 0.3–1.2)
Total Protein: 7 g/dL (ref 6.5–8.1)

## 2019-10-05 LAB — GLUCOSE, CAPILLARY
Glucose-Capillary: 89 mg/dL (ref 70–99)
Glucose-Capillary: 100 mg/dL — ABNORMAL HIGH (ref 70–99)
Glucose-Capillary: 122 mg/dL — ABNORMAL HIGH (ref 70–99)
Glucose-Capillary: 98 mg/dL (ref 70–99)

## 2019-10-05 LAB — PHOSPHORUS: Phosphorus: 3.5 mg/dL (ref 2.5–4.6)

## 2019-10-05 LAB — D-DIMER, QUANTITATIVE: D-Dimer, Quant: 1.26 ug/mL-FEU — ABNORMAL HIGH (ref 0.00–0.50)

## 2019-10-05 LAB — C-REACTIVE PROTEIN: CRP: 5.2 mg/dL — ABNORMAL HIGH (ref <1.0)

## 2019-10-05 LAB — URINE CULTURE: Culture: NO GROWTH

## 2019-10-05 LAB — FERRITIN: Ferritin: 267 ng/mL (ref 11–307)

## 2019-10-05 LAB — MAGNESIUM: Magnesium: 2.6 mg/dL — ABNORMAL HIGH (ref 1.7–2.4)

## 2019-10-05 MED ORDER — POLYETHYLENE GLYCOL 3350 17 G PO PACK
17.0000 g | PACK | Freq: Every day | ORAL | Status: DC
  Administered 2019-10-05 – 2019-10-08 (×3): 17 g via ORAL
  Filled 2019-10-05 (×4): qty 1

## 2019-10-05 MED ORDER — BISACODYL 10 MG RE SUPP
10.0000 mg | Freq: Once | RECTAL | Status: AC
  Administered 2019-10-05: 10 mg via RECTAL
  Filled 2019-10-05: qty 1

## 2019-10-05 NOTE — Care Management Important Message (Signed)
Important Message  Patient Details  Name: Rebecca Hansen MRN: [id number] Date of Birth: Mar 05, 1951   Medicare Important Message Given:  Yes(Emily, RN agreed to deliver letter to patient due to contact precautions)     Corey Harold 10/05/2019, 2:56 PM

## 2019-10-05 NOTE — Plan of Care (Signed)

## 2019-10-05 NOTE — Progress Notes (Signed)
PROGRESS NOTE  Rebecca Hansen:[id number] DOB: [birth year] DOA: 10/02/2019 PCP: [name], MD  Brief History: 69 year old female with a history of COPD, chronic pain syndrome, anxiety/depression presenting with coughing, shortness of breath, myalgias, and fatigue since 09/29/2019. The patient has been using her home bronchodilators without much improvement. She has some subjective fevers and chills. She denies any nausea, vomiting, diarrhea, abdominal pain, hemoptysis, hematochezia, melena. In the emergency department, the patient was found to be Covid positive. Chest x-ray showed increasing interstitial markings and bibasilar opacities, left greater than right. WBC 8.1, hemoglobin 11.4, platelets 133,000. The patient was noted to have increased inflammatory markers. She was started on IV steroids and remdesivir. Since admission, patient has had abdominal pain with nausea and vomiting.  Assessment/Plan: Acute on chronic respiratory failure with hypoxia -Secondary to COVID-19 pneumonia -The patient states that she normally uses oxygen prn 2-3 L -Presently stable on 3 L -CRP 21.0>>>11.1>>>5.2 -PCT 2.10>>3.34>>0.83 -Ferritin 154>>>308>>>267 -D-dimer 1.43>>>1.54>>>1.26 -Lactic acid peaked 1.4 -Fibrinogen 712 -Finish 5 days remdesivir 10/06/19 -continue dexamethasone  COVID-19 pneumonia/lobar pneumonia -Continue ceftriaxone and azithromycin (D#4) given elevation in procalcitonin  Cannabis abuse/recurrent vomiting -Suspect the patient may have cannabis hyperemesis syndrome -contiunue Zofran around-the-clock -Judicious IV fluids -vomiting improved over last 24 hours -downgrade diet to clear liquids only>>advance to full liquids 1/5 -started protonix bid -lipase 82 -CT abd/pelvis--neg for acute findings  Abdominal pain -etiology unclear--?constipation--no BM in one week -start miralax and bisacodyl suppository -10/04/19--CT abd/pelvis--negative for acute  findings -10/04/19 UA--neg for pyuria  COPD -continue Combivent -Continue Dulera  Chronic pain syndrome -PMP Aware queried -Patient receives monthly Percocet 10 mg, Xanax 1 mg, diazepam 5 mg, phentermine 37.5 mg  Thrombocytopenia -Secondary to infectious process -improved  Hypokalemia -Replete -Check magnesium 2.3  Anxiety/depression -Continue fluoxetine, home dose of alprazolam and diazepam  Hyperglycemia -check A1C       Disposition Plan: Home in 1-2days  Family Communication: Family at bedside  Consultants:none  Code Status: FULL  DVT Prophylaxis: [place] Lovenox   Procedures: As Listed in Progress Note Above  Antibiotics: Ceftriaxone 1/2>>> azithro 1/2>>     Subjective: Pt complains of abdominal pain.  States n/v are improving.  Denies cp, sob, diarrhea, dysuria.  No coughing or hemoptysis.  Objective: Vitals:   10/04/19 2214 10/05/19 0438 10/05/19 0445 10/05/19 1405  BP: (!) 158/89 127/85  (!) 157/88  Pulse: 93 (!) 117 88 75  Resp: 20 20  16   Temp: 98.5 F (36.9 C) 98.6 F (37 C)  99 F (37.2 C)  TempSrc: Oral Oral  Oral  SpO2: 93% 98%  98%  Weight:      Height:        Intake/Output Summary (Last 24 hours) at 10/05/2019 1526 Last data filed at 10/05/2019 1500 Gross per 24 hour  Intake 1737.65 ml  Output --  Net 1737.65 ml   Weight change:  Exam:   General:  Pt is alert, follows commands appropriately, not in acute distress  HEENT: No icterus, No thrush, No neck mass, [place]/AT  Cardiovascular: RRR, S1/S2, no rubs, no gallops  Respiratory: bibasilar rales. No wheeze  Abdomen: Soft/+BS, non tender, non distended, no guarding  Extremities: No edema, No lymphangitis, No petechiae, No rashes, no synovitis   Data Reviewed: I have personally reviewed following labs and imaging studies Basic Metabolic Panel: Recent Labs  Lab 10/02/19 0820 10/03/19 0926 10/04/19 0520 10/05/19 0506  NA 132* 141 140 141  K  3.4*  3.9 4.0 4.9  CL 97* 103 104 103  CO2 25 24 26 29   GLUCOSE 107* 139* 115* 112*  BUN 11 16 20 17   CREATININE 0.71 0.90 0.72 0.76  CALCIUM 8.2* 9.2 8.6* 8.8*  MG  --  2.5* 2.3 2.6*  PHOS  --  5.7* 3.8 3.5   Liver Function Tests: Recent Labs  Lab 10/02/19 0820 10/03/19 0926 10/04/19 0520 10/05/19 0506  AST 26 31 25 22   ALT 19 22 19 19   ALKPHOS 109 121 89 84  BILITOT 0.5 0.5 0.4 0.4  PROT 6.9 8.0 6.8 7.0  ALBUMIN 3.2* 3.4* 3.0* 3.2*   Recent Labs  Lab 10/04/19 1321  LIPASE 82*   No results for input(s): AMMONIA in the last 168 hours. Coagulation Profile: No results for input(s): INR, PROTIME in the last 168 hours. CBC: Recent Labs  Lab 10/02/19 0820 10/03/19 0926 10/04/19 0520 10/05/19 0506  WBC 8.1 8.6 13.1* 8.4  NEUTROABS 6.8 6.6 10.0* 5.9  HGB 11.4* 13.2 11.6* 13.0  HCT 34.9* 41.7 36.5 41.1  MCV 93.6 95.0 94.8 95.4  PLT 133* 201 203 211   Cardiac Enzymes: No results for input(s): CKTOTAL, CKMB, CKMBINDEX, TROPONINI in the last 168 hours. BNP: Invalid input(s): POCBNP CBG: Recent Labs  Lab 10/04/19 1258 10/04/19 1625 10/04/19 2215 10/05/19 0734 10/05/19 1049  GLUCAP 124* 109* 82 89 100*   HbA1C: No results for input(s): HGBA1C in the last 72 hours. Urine analysis:    Component Value Date/Time   COLORURINE STRAW (A) 10/04/2019 1510   APPEARANCEUR CLEAR 10/04/2019 1510   LABSPEC 1.009 10/04/2019 1510   PHURINE 7.0 10/04/2019 1510   GLUCOSEU NEGATIVE 10/04/2019 1510   HGBUR NEGATIVE 10/04/2019 1510   BILIRUBINUR NEGATIVE 10/04/2019 [place] 10/04/2019 1510   PROTEINUR NEGATIVE 10/04/2019 1510   NITRITE NEGATIVE 10/04/2019 1510   LEUKOCYTESUR NEGATIVE 10/04/2019 1510   Sepsis Labs: @LABRCNTIP (procalcitonin:4,lacticidven:4) ) Recent Results (from the past 240 hour(s))  Blood Culture (routine x 2)     Status: None (Preliminary result)   Collection Time: 10/02/19  8:50 AM   Specimen: Left Antecubital; Blood  Result Value  Ref Range Status   Specimen Description   Final    LEFT ANTECUBITAL BOTTLES DRAWN AEROBIC AND ANAEROBIC   Special Requests Blood Culture adequate volume  Final   Culture   Final    NO GROWTH 3 DAYS Performed at [facility], [address], [place], [place] [address]    Report Status PENDING  Incomplete  Blood Culture (routine x 2)     Status: None (Preliminary result)   Collection Time: 10/02/19  9:47 AM   Specimen: Right Antecubital; Blood  Result Value Ref Range Status   Specimen Description   Final    RIGHT ANTECUBITAL BOTTLES DRAWN AEROBIC AND ANAEROBIC   Special Requests Blood Culture adequate volume  Final   Culture   Final    NO GROWTH 3 DAYS Performed at [facility], [address], [place], [place] [address]    Report Status PENDING  Incomplete     Scheduled Meds: . vitamin C  500 mg Oral Daily  . enoxaparin (LOVENOX) injection  40 mg Subcutaneous Q24H  . FLUoxetine  20 mg Oral Daily  . fluticasone  2 spray Each Nare Daily  . folic acid  1 mg Oral Daily  . guaiFENesin  600 mg Oral BID  . insulin aspart  0-5 Units Subcutaneous QHS  . insulin aspart  0-9 Units Subcutaneous TID  WC  . Ipratropium-Albuterol  1 puff Inhalation Q6H  . loratadine  10 mg Oral Daily  . methylPREDNISolone (SOLU-MEDROL) injection  40 mg Intravenous Q12H  . mometasone-formoterol  2 puff Inhalation BID  . montelukast  10 mg Oral Daily  . multivitamin with minerals  1 tablet Oral Daily  . ondansetron (ZOFRAN) IV  4 mg Intravenous Q6H  . pantoprazole (PROTONIX) IV  40 mg Intravenous Q12H  . polyethylene glycol  17 g Oral Daily  . sodium chloride flush  3 mL Intravenous Q12H  . thiamine  100 mg Oral Daily  . traZODone  100 mg Oral QHS  . zinc sulfate  220 mg Oral Daily   Continuous Infusions: . sodium chloride 250 mL (10/02/19 1644)  . 0.9 % NaCl with KCl 20 mEq / L 75 mL/hr at 10/05/19 8550  . azithromycin Stopped (10/05/19 1038)  . cefTRIAXone (ROCEPHIN)  IV 1 g (10/05/19 0824)  .  remdesivir 100 mg in NS 100 mL 100 mg (10/05/19 1044)    Procedures/Studies: CT ABDOMEN PELVIS W CONTRAST  Result Date: 10/04/2019 CLINICAL DATA:  Recurrent vomiting, abdominal pain, nausea. COVID positive. EXAM: CT ABDOMEN AND PELVIS WITH CONTRAST TECHNIQUE: Multidetector CT imaging of the abdomen and pelvis was performed using the standard protocol following bolus administration of intravenous contrast. CONTRAST:  57mL OMNIPAQUE IOHEXOL 300 MG/ML SOLN, OMNIPAQUE IOHEXOL 300 MG/ML SOLN COMPARISON:  None. FINDINGS: Lower chest: Mild patchy subpleural opacities in the bilateral lower lobes, reflecting multifocal pneumonia in this patient with known COVID. Trace bilateral pleural effusions. Hepatobiliary: Liver is within normal limits. Gallbladder is unremarkable. No intrahepatic or extrahepatic ductal dilatation. Pancreas: Within normal limits. Spleen: Within normal limits. Adrenals/Urinary Tract: Adrenal glands are within normal limits. Small cysts along the posterior interpolar left kidney measuring up to 12 mm (series 2/image 32). Right kidney is within normal limits. No hydronephrosis. Bladder is within normal limits. Stomach/Bowel: Stomach is within normal limits. Visualized bowel is unremarkable. Normal appendix (series 2/image 64). No colonic wall thickening or inflammatory changes. Vascular/Lymphatic: No evidence of abdominal aortic aneurysm. Atherosclerotic calcifications of the abdominal aorta and branch vessels. No suspicious abdominopelvic lymphadenopathy. Reproductive: Status post hysterectomy. No adnexal mass. Other: No abdominopelvic ascites. Musculoskeletal: Degenerative changes of the visualized thoracolumbar spine. IMPRESSION: Multifocal pneumonia at the lung bases in this patient with known COVID. Trace bilateral pleural effusions. Otherwise negative CT abdomen/pelvis. Electronically Signed   By: Charline Bills M.D.   On: 10/04/2019 16:46   DG Chest Portable 1 View  Result Date:  10/02/2019 CLINICAL DATA:  Shortness of breath. EXAM: PORTABLE CHEST 1 VIEW COMPARISON:  January 02, 2016. FINDINGS: The heart size and mediastinal contours are within normal limits. No pneumothorax or pleural effusion is noted. Mild bibasilar subsegmental atelectasis or infiltrates are noted. The visualized skeletal structures are unremarkable. IMPRESSION: Mild bibasilar subsegmental atelectasis or infiltrates are noted. Electronically Signed   By: Lupita Raider M.D.   On: 10/02/2019 09:17    Catarina Hartshorn, DO  [facility] Pager [phone]  If 7PM-7AM, please contact night-coverage [email or web address] Password [email or web address] 10/05/2019, 3:26 PM   LOS: 3 days

## 2019-10-06 DIAGNOSIS — F419 Anxiety disorder, unspecified: Secondary | ICD-10-CM

## 2019-10-06 LAB — CBC
HCT: 43.3 % (ref 36.0–46.0)
Hemoglobin: 13.8 g/dL (ref 12.0–15.0)
MCH: 29.9 pg (ref 26.0–34.0)
MCHC: 31.9 g/dL (ref 30.0–36.0)
MCV: 93.9 fL (ref 80.0–100.0)
Platelets: 255 10*3/uL (ref 150–400)
RBC: 4.61 MIL/uL (ref 3.87–5.11)
RDW: 14 % (ref 11.5–15.5)
WBC: 5.8 10*3/uL (ref 4.0–10.5)
nRBC: 0 % (ref 0.0–0.2)

## 2019-10-06 LAB — COMPREHENSIVE METABOLIC PANEL
ALT: 19 U/L (ref 0–44)
AST: 20 U/L (ref 15–41)
Albumin: 3.4 g/dL — ABNORMAL LOW (ref 3.5–5.0)
Alkaline Phosphatase: 80 U/L (ref 38–126)
Anion gap: 11 (ref 5–15)
BUN: 18 mg/dL (ref 8–23)
CO2: 30 mmol/L (ref 22–32)
Calcium: 9.2 mg/dL (ref 8.9–10.3)
Chloride: 97 mmol/L — ABNORMAL LOW (ref 98–111)
Creatinine, Ser: 0.77 mg/dL (ref 0.44–1.00)
GFR calc Af Amer: >60 mL/min (ref >60)
GFR calc non Af Amer: >60 mL/min (ref >60)
Glucose, Bld: 114 mg/dL — ABNORMAL HIGH (ref 70–99)
Potassium: 4.6 mmol/L (ref 3.5–5.1)
Sodium: 138 mmol/L (ref 135–145)
Total Bilirubin: 0.6 mg/dL (ref 0.3–1.2)
Total Protein: 7.3 g/dL (ref 6.5–8.1)

## 2019-10-06 LAB — GLUCOSE, CAPILLARY
Glucose-Capillary: 108 mg/dL — ABNORMAL HIGH (ref 70–99)
Glucose-Capillary: 110 mg/dL — ABNORMAL HIGH (ref 70–99)
Glucose-Capillary: 145 mg/dL — ABNORMAL HIGH (ref 70–99)
Glucose-Capillary: 104 mg/dL — ABNORMAL HIGH (ref 70–99)

## 2019-10-06 LAB — D-DIMER, QUANTITATIVE: D-Dimer, Quant: 1.34 ug/mL-FEU — ABNORMAL HIGH (ref 0.00–0.50)

## 2019-10-06 LAB — C-REACTIVE PROTEIN: CRP: 2.9 mg/dL — ABNORMAL HIGH (ref <1.0)

## 2019-10-06 LAB — HEMOGLOBIN A1C
Hgb A1c MFr Bld: 5.2 % (ref 4.8–5.6)
Mean Plasma Glucose: 102.54 mg/dL

## 2019-10-06 LAB — FERRITIN: Ferritin: 221 ng/mL (ref 11–307)

## 2019-10-06 MED ORDER — DICYCLOMINE HCL 10 MG PO CAPS
10.0000 mg | ORAL_CAPSULE | Freq: Four times a day (QID) | ORAL | Status: DC | PRN
  Administered 2019-10-06 – 2019-10-08 (×4): 10 mg via ORAL
  Filled 2019-10-06 (×4): qty 1

## 2019-10-06 NOTE — Plan of Care (Signed)

## 2019-10-06 NOTE — Progress Notes (Signed)
PROGRESS NOTE  Rebecca Hansen NLG:[id number] DOB: 1950-10-04 DOA: 10/02/2019 PCP: [name], MD  Brief History: 69 year old female with a history of COPD, chronic pain syndrome, anxiety/depression presenting with coughing, shortness of breath, myalgias, and fatigue since 09/29/2019. The patient has been using her home bronchodilators without much improvement. She has some subjective fevers and chills. She denies any nausea, vomiting, diarrhea, abdominal pain, hemoptysis, hematochezia, melena. In the emergency department, the patient was found to be Covid positive. Chest x-ray showed increasing interstitial markings and bibasilar opacities, left greater than right. WBC 8.1, hemoglobin 11.4, platelets 133,000. The patient was noted to have increased inflammatory markers. She was started on IV steroids and remdesivir. Since admission, patient has had abdominal pain with nausea and vomiting.  Assessment/Plan: Acute on chronic respiratory failure with hypoxia -Secondary to COVID-19 pneumonia -The patient states that she normally uses oxygen prn 2-3 L -Presently stable on 3 L -CRP 21.0>>>11.1>>>5.2 -PCT 2.10>>3.34>>0.83 -Ferritin 154>>>308>>>267 -D-dimer 1.43>>>1.54>>>1.26 -Lactic acid peaked 1.4 -Fibrinogen 712 -Finished 5 days remdesivir on 10/06/19 -continue steroids therapy. -Hopefully discharge home soon.  COVID-19 pneumonia/lobar pneumonia -Continue ceftriaxone and azithromycin (D#5) given elevation in procalcitonin -Will complete antibiotic therapy after today's dose.  Cannabis abuse/recurrent vomiting -Suspect the patient may have cannabis hyperemesis syndrome -contiunue Zofran around-the-clock -Continue judicious IV fluids and advance diet to full liquid. -started protonix bid -lipase 82 -CT abd/pelvis--neg for acute findings  Abdominal pain -etiology unclear--?  Associated to constipation--no BM in one week -Continue miralax and bisacodyl suppository;  with intentions to start Linzess if needed. -10/04/19--CT abd/pelvis--negative for acute findings -10/04/19 UA--neg for pyuria  COPD -continue Combivent -Continue Dulera -No wheezing currently.  Chronic pain syndrome -PMP Aware queried -Patient receives monthly Percocet 10 mg, Xanax 1 mg, diazepam 5 mg, phentermine 37.5 mg  Thrombocytopenia -Secondary to infectious process -improved -No signs of overt bleeding.  Hypokalemia -In the setting of GI losses -Continue to follow trend and replete electrolytes as needed. -Magnesium within normal limits  Anxiety/depression -Continue fluoxetine, home dose of alprazolam and diazepam -No suicidal ideation or hallucination. -Overall stable mood.  Hyperglycemia -In the setting of steroids usage -Will check A1c     Disposition Plan: Home in 1-2days  Family Communication: Family at bedside  Consultants:none  Code Status: FULL  DVT Prophylaxis: [place] Lovenox   Procedures: As Listed in Progress Note Above  Antibiotics: Ceftriaxone 1/2>>> azithro 1/2>>     Subjective: Continue complaining of abdominal pain.  Reports no further vomiting.  Still having intermittent nausea but tolerating clear liquid diet.  Patient is afebrile with good oxygen saturation on 2 L.  Objective: Vitals:   10/05/19 1405 10/05/19 2037 10/06/19 0517 10/06/19 1425  BP: (!) 157/88 (!) 157/96 134/65 (!) 163/86  Pulse: 75 77 64 89  Resp: 16 20 20 20   Temp: 99 F (37.2 C) 98.8 F (37.1 C) 98.9 F (37.2 C) 98 F (36.7 C)  TempSrc: Oral Oral Oral Oral  SpO2: 98% 98% 97% 98%  Weight:      Height:        Intake/Output Summary (Last 24 hours) at 10/06/2019 1516 Last data filed at 10/06/2019 1300 Gross per 24 hour  Intake 1080 ml  Output --  Net 1080 ml   Weight change:   Exam: General exam: Alert, awake, oriented x 3, reporting some abdominal pain and intermittent nausea.  No further vomiting.  Good oxygen saturation on 2 L  nasal cannula; denies chest pain. Respiratory  system: Positive rhonchi; no using accessory muscles. Respiratory effort normal. Cardiovascular system:RRR. No murmurs, rubs, gallops. Gastrointestinal system: Abdomen is nondistended, soft and nontender. No organomegaly or masses felt. Normal bowel sounds heard. Central nervous system: Alert and oriented. No focal neurological deficits. Extremities: No C/C/E, +pedal pulses Skin: No rashes, lesions or ulcers Psychiatry: Judgement and insight appear normal. Mood & affect appropriate.    Data Reviewed: I have personally reviewed following labs and imaging studies  Basic Metabolic Panel: Recent Labs  Lab 10/02/19 0820 10/03/19 0926 10/04/19 0520 10/05/19 0506 10/06/19 0324  NA 132* 141 140 141 138  K 3.4* 3.9 4.0 4.9 4.6  CL 97* 103 104 103 97*  CO2 25 24 26 29 30   GLUCOSE 107* 139* 115* 112* 114*  BUN 11 16 20 17 18   CREATININE 0.71 0.90 0.72 0.76 0.77  CALCIUM 8.2* 9.2 8.6* 8.8* 9.2  MG  --  2.5* 2.3 2.6*  --   PHOS  --  5.7* 3.8 3.5  --    Liver Function Tests: Recent Labs  Lab 10/02/19 0820 10/03/19 0926 10/04/19 0520 10/05/19 0506 10/06/19 0324  AST 26 31 25 22 20   ALT 19 22 19 19 19   ALKPHOS 109 121 89 84 80  BILITOT 0.5 0.5 0.4 0.4 0.6  PROT 6.9 8.0 6.8 7.0 7.3  ALBUMIN 3.2* 3.4* 3.0* 3.2* 3.4*   Recent Labs  Lab 10/04/19 1321  LIPASE 82*   CBC: Recent Labs  Lab 10/02/19 0820 10/03/19 0926 10/04/19 0520 10/05/19 0506 10/06/19 0324  WBC 8.1 8.6 13.1* 8.4 5.8  NEUTROABS 6.8 6.6 10.0* 5.9  --   HGB 11.4* 13.2 11.6* 13.0 13.8  HCT 34.9* 41.7 36.5 41.1 43.3  MCV 93.6 95.0 94.8 95.4 93.9  PLT 133* 201 203 211 255   CBG: Recent Labs  Lab 10/05/19 1049 10/05/19 1607 10/05/19 2035 10/06/19 0723 10/06/19 1057  GLUCAP 100* 122* 98 108* 110*   Urine analysis:    Component Value Date/Time   COLORURINE STRAW (A) 10/04/2019 1510   APPEARANCEUR CLEAR 10/04/2019 1510   LABSPEC 1.009 10/04/2019 1510    PHURINE 7.0 10/04/2019 1510   GLUCOSEU NEGATIVE 10/04/2019 1510   HGBUR NEGATIVE 10/04/2019 1510   BILIRUBINUR NEGATIVE 10/04/2019 1510   KETONESUR NEGATIVE 10/04/2019 1510   PROTEINUR NEGATIVE 10/04/2019 1510   NITRITE NEGATIVE 10/04/2019 1510   LEUKOCYTESUR NEGATIVE 10/04/2019 1510    Recent Results (from the past 240 hour(s))  Blood Culture (routine x 2)     Status: None (Preliminary result)   Collection Time: 10/02/19  8:50 AM   Specimen: Left Antecubital; Blood  Result Value Ref Range Status   Specimen Description   Final    LEFT ANTECUBITAL BOTTLES DRAWN AEROBIC AND ANAEROBIC   Special Requests Blood Culture adequate volume  Final   Culture   Final    NO GROWTH 4 DAYS Performed at [facility], [address] [place], 11/30/19 [facility]    Report Status PENDING  Incomplete  Blood Culture (routine x 2)     Status: None (Preliminary result)   Collection Time: 10/02/19  9:47 AM   Specimen: Right Antecubital; Blood  Result Value Ref Range Status   Specimen Description   Final    RIGHT ANTECUBITAL BOTTLES DRAWN AEROBIC AND ANAEROBIC   Special Requests Blood Culture adequate volume  Final   Culture   Final    NO GROWTH 4 DAYS Performed at [facility], [address], [place], 11/30/19 [facility]  Report Status PENDING  Incomplete  Culture, Urine     Status: None   Collection Time: 10/04/19  3:10 PM   Specimen: Urine, Random  Result Value Ref Range Status   Specimen Description   Final    URINE, RANDOM Performed at [facility], [address], [place], [place] [address]    Special Requests   Final    NONE Performed at [facility], [address] [place], [place] [address]    Culture   Final    NO GROWTH Performed at [facility], [address] [place], [place] [address]    Report Status 10/05/2019 FINAL  Final     Scheduled Meds: . vitamin C  500 mg Oral Daily  . enoxaparin (LOVENOX) injection  40 mg Subcutaneous Q24H  . FLUoxetine  20 mg Oral Daily  .  fluticasone  2 spray Each Nare Daily  . folic acid  1 mg Oral Daily  . guaiFENesin  600 mg Oral BID  . insulin aspart  0-5 Units Subcutaneous QHS  . insulin aspart  0-9 Units Subcutaneous TID WC  . Ipratropium-Albuterol  1 puff Inhalation Q6H  . loratadine  10 mg Oral Daily  . methylPREDNISolone (SOLU-MEDROL) injection  40 mg Intravenous Q12H  . mometasone-formoterol  2 puff Inhalation BID  . montelukast  10 mg Oral Daily  . multivitamin with minerals  1 tablet Oral Daily  . ondansetron (ZOFRAN) IV  4 mg Intravenous Q6H  . pantoprazole (PROTONIX) IV  40 mg Intravenous Q12H  . polyethylene glycol  17 g Oral Daily  . sodium chloride flush  3 mL Intravenous Q12H  . thiamine  100 mg Oral Daily  . traZODone  100 mg Oral QHS  . zinc sulfate  220 mg Oral Daily   Continuous Infusions: . sodium chloride 250 mL (10/02/19 1644)  . azithromycin 500 mg (10/06/19 0931)  . cefTRIAXone (ROCEPHIN)  IV 1 g (10/06/19 0810)    Procedures/Studies: CT ABDOMEN PELVIS W CONTRAST  Result Date: 10/04/2019 CLINICAL DATA:  Recurrent vomiting, abdominal pain, nausea. COVID positive. EXAM: CT ABDOMEN AND PELVIS WITH CONTRAST TECHNIQUE: Multidetector CT imaging of the abdomen and pelvis was performed using the standard protocol following bolus administration of intravenous contrast. CONTRAST:  20mL OMNIPAQUE IOHEXOL 300 MG/ML SOLN, OMNIPAQUE IOHEXOL 300 MG/ML SOLN COMPARISON:  None. FINDINGS: Lower chest: Mild patchy subpleural opacities in the bilateral lower lobes, reflecting multifocal pneumonia in this patient with known COVID. Trace bilateral pleural effusions. Hepatobiliary: Liver is within normal limits. Gallbladder is unremarkable. No intrahepatic or extrahepatic ductal dilatation. Pancreas: Within normal limits. Spleen: Within normal limits. Adrenals/Urinary Tract: Adrenal glands are within normal limits. Small cysts along the posterior interpolar left kidney measuring up to 12 mm (series 2/image 32).  Right kidney is within normal limits. No hydronephrosis. Bladder is within normal limits. Stomach/Bowel: Stomach is within normal limits. Visualized bowel is unremarkable. Normal appendix (series 2/image 64). No colonic wall thickening or inflammatory changes. Vascular/Lymphatic: No evidence of abdominal aortic aneurysm. Atherosclerotic calcifications of the abdominal aorta and branch vessels. No suspicious abdominopelvic lymphadenopathy. Reproductive: Status post hysterectomy. No adnexal mass. Other: No abdominopelvic ascites. Musculoskeletal: Degenerative changes of the visualized thoracolumbar spine. IMPRESSION: Multifocal pneumonia at the lung bases in this patient with known COVID. Trace bilateral pleural effusions. Otherwise negative CT abdomen/pelvis. Electronically Signed   By: Charline Bills M.D.   On: 10/04/2019 16:46   DG Chest Portable 1 View  Result Date: 10/02/2019 CLINICAL DATA:  Shortness  of breath. EXAM: PORTABLE CHEST 1 VIEW COMPARISON:  January 02, 2016. FINDINGS: The heart size and mediastinal contours are within normal limits. No pneumothorax or pleural effusion is noted. Mild bibasilar subsegmental atelectasis or infiltrates are noted. The visualized skeletal structures are unremarkable. IMPRESSION: Mild bibasilar subsegmental atelectasis or infiltrates are noted. Electronically Signed   By: Marijo Conception M.D.   On: 10/02/2019 09:17    Barton Dubois, MD  [facility] Pager [phone]   10/06/2019, 3:16 PM   LOS: 4 days

## 2019-10-07 LAB — CULTURE, BLOOD (ROUTINE X 2)
Culture: NO GROWTH
Culture: NO GROWTH
Special Requests: ADEQUATE
Special Requests: ADEQUATE

## 2019-10-07 LAB — GLUCOSE, CAPILLARY
Glucose-Capillary: 118 mg/dL — ABNORMAL HIGH (ref 70–99)
Glucose-Capillary: 140 mg/dL — ABNORMAL HIGH (ref 70–99)
Glucose-Capillary: 163 mg/dL — ABNORMAL HIGH (ref 70–99)
Glucose-Capillary: 88 mg/dL (ref 70–99)

## 2019-10-07 MED ORDER — FLUOXETINE HCL 20 MG PO CAPS
60.0000 mg | ORAL_CAPSULE | Freq: Every day | ORAL | Status: DC
  Administered 2019-10-08: 60 mg via ORAL
  Filled 2019-10-07: qty 3

## 2019-10-07 MED ORDER — ALPRAZOLAM 1 MG PO TABS
1.0000 mg | ORAL_TABLET | Freq: Three times a day (TID) | ORAL | Status: DC
  Administered 2019-10-07 – 2019-10-08 (×3): 1 mg via ORAL
  Filled 2019-10-07 (×4): qty 1

## 2019-10-07 MED ORDER — HYDRALAZINE HCL 25 MG PO TABS
25.0000 mg | ORAL_TABLET | Freq: Four times a day (QID) | ORAL | Status: DC | PRN
  Administered 2019-10-07: 06:00:00 25 mg via ORAL
  Filled 2019-10-07: qty 1

## 2019-10-07 NOTE — Progress Notes (Signed)
PROGRESS NOTE  Rebecca Hansen JQB:[id number] DOB: 1950-12-13 DOA: 10/02/2019 PCP: [name], MD  Brief History: 69 year old female with a history of COPD, chronic pain syndrome, anxiety/depression presenting with coughing, shortness of breath, myalgias, and fatigue since 09/29/2019. The patient has been using her home bronchodilators without much improvement. She has some subjective fevers and chills. She denies any nausea, vomiting, diarrhea, abdominal pain, hemoptysis, hematochezia, melena. In the emergency department, the patient was found to be Covid positive. Chest x-ray showed increasing interstitial markings and bibasilar opacities, left greater than right. WBC 8.1, hemoglobin 11.4, platelets 133,000. The patient was noted to have increased inflammatory markers. She was started on IV steroids and remdesivir. Since admission, patient has had abdominal pain with nausea and vomiting.  Assessment/Plan: Acute on chronic respiratory failure with hypoxia -Secondary to COVID-19 pneumonia -The patient states that she normally uses oxygen prn 2-3 L -Presently stable on 2 L -CRP 21.0>>>11.1>>>5.2 -PCT 2.10>>3.34>>0.83 -Ferritin 154>>>308>>>267 -D-dimer 1.43>>>1.54>>>1.26 -Lactic acid peaked 1.4 -Fibrinogen 712 -Finished 5 days remdesivir on 10/06/19 -continue steroids therapy. -Hopefully discharge home in a.m.  COVID-19 pneumonia/lobar pneumonia -Patient has completed antibiotic therapy on 10/06/2019 -Has remained afebrile and with stable breathing. -Continue using incentive respirometer.  Cannabis abuse/recurrent vomiting -Suspect the patient may have cannabis hyperemesis syndrome -contiunue Zofran around-the-clock -Continue judicious IV fluids and advance diet to hard/modified carbohydrate.  (Regular consistency). -started protonix bid -lipase 82 -CT abd/pelvis--neg for acute findings  Abdominal pain -etiology unclear--?  Associated to constipation--no BM in  one week -Continue miralax and bisacodyl suppository; with intentions to start Linzess if needed. -10/04/19--CT abd/pelvis--negative for acute findings -10/04/19 UA--neg for pyuria  COPD -continue Combivent -Continue Dulera -No wheezing currently.  Chronic pain syndrome -PMP Aware queried -Patient receives monthly Percocet 10 mg, Xanax 1 mg, diazepam 5 mg, phentermine 37.5 mg  Thrombocytopenia -Secondary to infectious process -improved -No signs of overt bleeding.  Hypokalemia -In the setting of GI losses -Continue to follow trend and replete electrolytes as needed. -Magnesium within normal limits  Anxiety/depression -Continue fluoxetine, case discussed with patient's PCP who reported reviewed dosages of her anxiolytic and antidepressant medications. -Dose has been adjusted -Patient currently denying suicidal ideation hallucination -Follow response.   Hyperglycemia -In the setting of steroids usage -A1c 5.2    Disposition Plan: Home tomorrow. Family Communication: Family at bedside  Consultants:none  Code Status: FULL  DVT Prophylaxis: [place] Lovenox   Procedures: As Listed in Progress Note Above  Antibiotics: Ceftriaxone 1/2>>>1/6 azithro 1/2>>1/6     Subjective: Still having intermittent abdominal discomfort; no further vomiting.  Reports tolerating full liquid diet, but having some nausea.  Overnight significantly restless, agitated and experiencing some hallucinations.  Objective: Vitals:   10/06/19 2104 10/07/19 0508 10/07/19 0509 10/07/19 1413  BP: (!) 147/92 (!) 169/104 (!) 172/109 (!) 142/72  Pulse: 82 83 86 96  Resp: 20 20  18   Temp: 99.3 F (37.4 C) 99.9 F (37.7 C)  99 F (37.2 C)  TempSrc: Oral Oral  Oral  SpO2: 97% 99% 97% 97%  Weight:      Height:        Intake/Output Summary (Last 24 hours) at 10/07/2019 1652 Last data filed at 10/06/2019 1700 Gross per 24 hour  Intake 360 ml  Output --  Net 360 ml   Weight  change:   Exam: General exam: Alert, awake, oriented x 3; patient reports no chest pain, no nausea, no vomiting.  Overall improved breathing and  expressed tolerating full liquid diet.  Still having intermittent abdominal discomfort and expressed having some hallucinations and significant restlessness/agitation overnight.   Respiratory system: Positive rhonchi bilaterally; no using accessory muscles.  Respiratory effort normal.  Good O2 sat on chronic 2 L nasal cannula supplementation.  No wheezing  Cardiovascular system:RRR. No murmurs, rubs, gallops. Gastrointestinal system: Abdomen is nondistended, soft and nontender on palpation. No organomegaly or masses felt. Normal bowel sounds heard. Central nervous system: Alert and oriented. No focal neurological deficits. Extremities: No C/C/E, +pedal pulses Skin: No rashes, lesions or ulcers Psychiatry: Judgement and insight appear normal. Mood & affect appropriate.   Data Reviewed: I have personally reviewed following labs and imaging studies  Basic Metabolic Panel: Recent Labs  Lab 10/02/19 0820 10/03/19 0926 10/04/19 0520 10/05/19 0506 10/06/19 0324  NA 132* 141 140 141 138  K 3.4* 3.9 4.0 4.9 4.6  CL 97* 103 104 103 97*  CO2 25 24 26 29 30   GLUCOSE 107* 139* 115* 112* 114*  BUN 11 16 20 17 18   CREATININE 0.71 0.90 0.72 0.76 0.77  CALCIUM 8.2* 9.2 8.6* 8.8* 9.2  MG  --  2.5* 2.3 2.6*  --   PHOS  --  5.7* 3.8 3.5  --    Liver Function Tests: Recent Labs  Lab 10/02/19 0820 10/03/19 0926 10/04/19 0520 10/05/19 0506 10/06/19 0324  AST 26 31 25 22 20   ALT 19 22 19 19 19   ALKPHOS 109 121 89 84 80  BILITOT 0.5 0.5 0.4 0.4 0.6  PROT 6.9 8.0 6.8 7.0 7.3  ALBUMIN 3.2* 3.4* 3.0* 3.2* 3.4*   Recent Labs  Lab 10/04/19 1321  LIPASE 82*   CBC: Recent Labs  Lab 10/02/19 0820 10/03/19 0926 10/04/19 0520 10/05/19 0506 10/06/19 0324  WBC 8.1 8.6 13.1* 8.4 5.8  NEUTROABS 6.8 6.6 10.0* 5.9  --   HGB 11.4* 13.2 11.6* 13.0 13.8    HCT 34.9* 41.7 36.5 41.1 43.3  MCV 93.6 95.0 94.8 95.4 93.9  PLT 133* 201 203 211 255   CBG: Recent Labs  Lab 10/06/19 1057 10/06/19 1559 10/06/19 2103 10/07/19 0755 10/07/19 1134  GLUCAP 110* 145* 104* 118* 140*   Urine analysis:    Component Value Date/Time   COLORURINE STRAW (A) 10/04/2019 1510   APPEARANCEUR CLEAR 10/04/2019 1510   LABSPEC 1.009 10/04/2019 1510   PHURINE 7.0 10/04/2019 1510   GLUCOSEU NEGATIVE 10/04/2019 1510   HGBUR NEGATIVE 10/04/2019 1510   BILIRUBINUR NEGATIVE 10/04/2019 1510   KETONESUR NEGATIVE 10/04/2019 1510   PROTEINUR NEGATIVE 10/04/2019 1510   NITRITE NEGATIVE 10/04/2019 1510   LEUKOCYTESUR NEGATIVE 10/04/2019 1510    Recent Results (from the past 240 hour(s))  Blood Culture (routine x 2)     Status: None   Collection Time: 10/02/19  8:50 AM   Specimen: Left Antecubital; Blood  Result Value Ref Range Status   Specimen Description   Final    LEFT ANTECUBITAL BOTTLES DRAWN AEROBIC AND ANAEROBIC   Special Requests Blood Culture adequate volume  Final   Culture   Final    NO GROWTH 5 DAYS Performed at [facility], [address], [place], 11/30/19 [facility]    Report Status 10/07/2019 FINAL  Final  Blood Culture (routine x 2)     Status: None   Collection Time: 10/02/19  9:47 AM   Specimen: Right Antecubital; Blood  Result Value Ref Range Status   Specimen Description   Final    RIGHT ANTECUBITAL BOTTLES DRAWN AEROBIC  AND ANAEROBIC   Special Requests Blood Culture adequate volume  Final   Culture   Final    NO GROWTH 5 DAYS Performed at [facility], [address] [place], [place] [address]    Report Status 10/07/2019 FINAL  Final  Culture, Urine     Status: None   Collection Time: 10/04/19  3:10 PM   Specimen: Urine, Random  Result Value Ref Range Status   Specimen Description   Final    URINE, RANDOM Performed at [facility], [address], [place], [place] [address]    Special Requests   Final    NONE Performed at  [facility], [address] [place], [place] [address]    Culture   Final    NO GROWTH Performed at [facility], [address], [place], [place] [address]    Report Status 10/05/2019 FINAL  Final     Scheduled Meds: . ALPRAZolam  1 mg Oral TID  . vitamin C  500 mg Oral Daily  . enoxaparin (LOVENOX) injection  40 mg Subcutaneous Q24H  . [START ON 10/08/2019] FLUoxetine  60 mg Oral Daily  . fluticasone  2 spray Each Nare Daily  . folic acid  1 mg Oral Daily  . guaiFENesin  600 mg Oral BID  . insulin aspart  0-5 Units Subcutaneous QHS  . insulin aspart  0-9 Units Subcutaneous TID WC  . Ipratropium-Albuterol  1 puff Inhalation Q6H  . loratadine  10 mg Oral Daily  . methylPREDNISolone (SOLU-MEDROL) injection  40 mg Intravenous Q12H  . mometasone-formoterol  2 puff Inhalation BID  . montelukast  10 mg Oral Daily  . multivitamin with minerals  1 tablet Oral Daily  . ondansetron (ZOFRAN) IV  4 mg Intravenous Q6H  . pantoprazole (PROTONIX) IV  40 mg Intravenous Q12H  . polyethylene glycol  17 g Oral Daily  . sodium chloride flush  3 mL Intravenous Q12H  . thiamine  100 mg Oral Daily  . traZODone  100 mg Oral QHS  . zinc sulfate  220 mg Oral Daily   Continuous Infusions: . sodium chloride 250 mL (10/02/19 1644)    Procedures/Studies: CT ABDOMEN PELVIS W CONTRAST  Result Date: 10/04/2019 CLINICAL DATA:  Recurrent vomiting, abdominal pain, nausea. COVID positive. EXAM: CT ABDOMEN AND PELVIS WITH CONTRAST TECHNIQUE: Multidetector CT imaging of the abdomen and pelvis was performed using the standard protocol following bolus administration of intravenous contrast. CONTRAST:  22mL OMNIPAQUE IOHEXOL 300 MG/ML SOLN, OMNIPAQUE IOHEXOL 300 MG/ML SOLN COMPARISON:  None. FINDINGS: Lower chest: Mild patchy subpleural opacities in the bilateral lower lobes, reflecting multifocal pneumonia in this patient with known COVID. Trace bilateral pleural effusions. Hepatobiliary: Liver is within  normal limits. Gallbladder is unremarkable. No intrahepatic or extrahepatic ductal dilatation. Pancreas: Within normal limits. Spleen: Within normal limits. Adrenals/Urinary Tract: Adrenal glands are within normal limits. Small cysts along the posterior interpolar left kidney measuring up to 12 mm (series 2/image 32). Right kidney is within normal limits. No hydronephrosis. Bladder is within normal limits. Stomach/Bowel: Stomach is within normal limits. Visualized bowel is unremarkable. Normal appendix (series 2/image 64). No colonic wall thickening or inflammatory changes. Vascular/Lymphatic: No evidence of abdominal aortic aneurysm. Atherosclerotic calcifications of the abdominal aorta and branch vessels. No suspicious abdominopelvic lymphadenopathy. Reproductive: Status post hysterectomy. No adnexal mass. Other: No abdominopelvic ascites. Musculoskeletal: Degenerative changes of the visualized thoracolumbar spine. IMPRESSION: Multifocal pneumonia at the lung bases in this patient with known COVID. Trace bilateral pleural  effusions. Otherwise negative CT abdomen/pelvis. Electronically Signed   By: Julian Hy M.D.   On: 10/04/2019 16:46   DG Chest Portable 1 View  Result Date: 10/02/2019 CLINICAL DATA:  Shortness of breath. EXAM: PORTABLE CHEST 1 VIEW COMPARISON:  January 02, 2016. FINDINGS: The heart size and mediastinal contours are within normal limits. No pneumothorax or pleural effusion is noted. Mild bibasilar subsegmental atelectasis or infiltrates are noted. The visualized skeletal structures are unremarkable. IMPRESSION: Mild bibasilar subsegmental atelectasis or infiltrates are noted. Electronically Signed   By: Marijo Conception M.D.   On: 10/02/2019 09:17    Barton Dubois, MD  [facility] Pager [phone]   10/07/2019, 4:52 PM   LOS: 5 days

## 2019-10-08 DIAGNOSIS — J449 Chronic obstructive pulmonary disease, unspecified: Secondary | ICD-10-CM

## 2019-10-08 LAB — GLUCOSE, CAPILLARY
Glucose-Capillary: 107 mg/dL — ABNORMAL HIGH (ref 70–99)
Glucose-Capillary: 123 mg/dL — ABNORMAL HIGH (ref 70–99)

## 2019-10-08 MED ORDER — DICYCLOMINE HCL 10 MG PO CAPS: 10.0000 mg | ORAL_CAPSULE | Freq: Four times a day (QID) | ORAL | 0 refills | Status: DC | PRN

## 2019-10-08 MED ORDER — PREDNISONE 10 MG PO TABS: 10.0000 mg | ORAL_TABLET | Freq: Every day | ORAL | 0 refills | Status: DC

## 2019-10-08 MED ORDER — FLUOXETINE HCL 20 MG PO CAPS: 60.0000 mg | ORAL_CAPSULE | Freq: Every day | ORAL | Status: AC

## 2019-10-08 MED ORDER — ADULT MULTIVITAMIN W/MINERALS CH: 1.0000 | ORAL_TABLET | Freq: Every day | ORAL | 1 refills | Status: DC

## 2019-10-08 MED ORDER — PANTOPRAZOLE SODIUM 40 MG PO TBEC: 40.0000 mg | DELAYED_RELEASE_TABLET | Freq: Every day | ORAL | 1 refills | Status: DC

## 2019-10-08 MED ORDER — GUAIFENESIN-DM 100-10 MG/5ML PO SYRP: 10.0000 mL | ORAL_SOLUTION | ORAL | 0 refills | Status: DC | PRN

## 2019-10-08 NOTE — Discharge Summary (Signed)
Physician Discharge Summary  Rebecca Hansen EZM:[id number] DOB: [birth year] DOA: 10/02/2019  PCP: [name], MD  Admit date: 10/02/2019 Discharge date: 10/08/2019  Time spent: 35 minutes  Recommendations for Outpatient Follow-up:  1. Repeat CBC to follow platelets trend 2. -Repeat basic metabolic panel and magnesium level to follow electrolytes and renal function 3. Repeat chest x-ray in 6 weeks to assure complete resolution of infiltrates.   Discharge Diagnoses:  Principal Problem:   Pneumonia due to COVID-19 virus Active Problems:   Anxiety   Acute respiratory disease due to COVID-19 virus   COPD (chronic obstructive pulmonary disease) (HCC)   Acute on chronic respiratory failure with hypoxia due to Covid pneumonia   Lobar pneumonia (HCC)   Thrombocytopenia (HCC)   Chronic pain syndrome   Intractable vomiting   Discharge Condition: Stable and improved.  Patient discharged home with instruction to follow-up with PCP in 10 days.  Diet recommendation: Modified carbohydrate diet; advised to increase fiber intake.  Filed Weights   10/02/19 1351  Weight: 70.3 kg    History of present illness:  69 year old female with a history of COPD, chronic pain syndrome, anxiety/depression presenting with coughing, shortness of breath, myalgias, and fatigue since 09/29/2019. The patient has been using her home bronchodilators without much improvement. She has some subjective fevers and chills. She denies any nausea, vomiting, diarrhea, abdominal pain, hemoptysis, hematochezia, melena. In the emergency department, the patient was found to be Covid positive. Chest x-ray showed increasing interstitial markings and bibasilar opacities, left greater than right. WBC 8.1, hemoglobin 11.4, platelets 133,000. The patient was noted to have increased inflammatory markers. She was started on IV steroids and remdesivir. Since admission, patient has had abdominal pain with nausea and  vomiting.   Hospital Course:  Acute on chronic respiratory failure with hypoxia -Secondary to COVID-19 pneumonia -The patient states that she normally uses oxygen prn 2-3 L -Presently stable on 2 L -CRP 21.0>>>11.1>>>5.2 -PCT 2.10>>3.34>>0.83 -Ferritin 154>>>308>>>267 -D-dimer 1.43>>>1.54>>>1.26 -Lactic acid peaked 1.4 -Fibrinogen 712 -Finished 5 days remdesivir on 10/06/19 -continue steroids tapering at discharge as per protocol. -Patient advised to maintain adequate hydration and to follow-up with PCP in 10 days.  COVID-19 pneumonia/lobar pneumonia -Patient has completed antibiotic therapy on 10/06/2019 -Has remained afebrile and with stable breathing. -Continue using incentive respirometer. -Repeat chest x-ray in approximately 6 weeks to assure complete resolution of infiltrates.  Cannabis abuse/recurrent vomiting -Suspect the patient may have cannabis hyperemesis syndrome along with acute Covid 19 gastroenteritis. -Patient advised to maintain adequate hydration -Cessation counseling about use of cannabis provided. -Continue Protonix at discharge. -lipase 82 -CT abd/pelvis--neg for acute findings  Abdominal pain -etiology unclear--?  Associated to constipation--no BM in one week -Good improvement with the use of MiraLAX and bisacodyl while inpatient -Given chronic use of narcotics will recommend initiation of Linzess as an outpatient if needed. -10/04/19--CT abd/pelvis--negative for acute findings -10/04/19 UA--neg for pyuria -Patient advised to increase fiber intake and to maintain adequate hydration.  Chronic respiratory failure secondary to COPD -Good air movement and no wheezing -Patient is speaking in full sentences -Good O2 sat on chronic 2 L nasal cannula supplementation -Resume home inhalers/nebulizer management -Resume the use of Singulair -Patient will complete steroids tapering as per Covid 19 infection protocol.  Chronic pain syndrome -PMP Aware queried  by Dr. Arbutus Leas -Patient receives monthly Percocet 10 mg, Xanax 1 mg, and as needed diazepam by 1 provider (no red flags). -No prescriptions provided from hospitalist standpoint at time of discharge.  Thrombocytopenia -Secondary to  infectious process -improved -No signs of overt bleeding. -Repeat CBC-obesity to reassess platelets count.  Hypokalemia -In the setting of GI losses -Improve and within normal limits after repletion. -Repeat basic metabolic panel to follow electrolytes trend -Magnesium within normal limits. -Advised to maintain adequate oral nutrition and hydration.  Anxiety/depression -Continue fluoxetine and Xanax; case discussed with patient's PCP who provided reviewed/updated dosages of her anxiolytic and antidepressant medications. -Dose has been adjusted and back to her outpatient regimen. -Patient is currently denying suicidal ideation hallucination -Continue outpatient follow-up with her PCP and if needed referral to psychiatry service.  Hyperglycemia -In the setting of steroids usage -A1c 5.2   Procedures:  See below for x-ray reports  Consultations:  None  Discharge Exam: Vitals:   10/07/19 2128 10/08/19 0456  BP: (!) 150/79 (!) 167/119  Pulse: 84 78  Resp: 18 18  Temp: 99.5 F (37.5 C) 98.5 F (36.9 C)  SpO2: 99% 97%    General: Afebrile, no chest pain, no nausea, no vomiting; patient reports tolerating regular consistency diet.  Still with some intermittent abdominal cramps, but significantly improved and feeling ready to go home.  Speaking in full sentences and with good oxygen saturation on chronic 2 L nasal cannula supplementation. Cardiovascular: S1 and S2, no rubs, no gallops, no JVD. Respiratory: Improved air movement bilaterally, positive for scattered rhonchi; no using accessory muscles.  Normal respiratory effort. Abdomen: Soft, nontender on palpation, no distention or guarding appreciated.  Positive bowel sounds. Extremities: No  cyanosis, no clubbing.  Discharge Instructions   Discharge Instructions    Discharge instructions   Complete by: As directed    Take medications as prescribed Maintain adequate hydration Arrange follow-up with PCP in 10 days Follow modified carbohydrate diet.   Increase activity slowly   Complete by: As directed    MyChart COVID-19 home monitoring program   Complete by: Oct 08, 2019    Is the patient willing to use the [facility] for home monitoring?: Yes   Temperature monitoring   Complete by: Oct 08, 2019    After how many days would you like to receive a notification of this patient's flowsheet entries?: 1     Allergies as of 10/08/2019   No Known Allergies     Medication List    STOP taking these medications   HYDROcodone-acetaminophen 10-325 MG tablet Commonly known as: NORCO   levofloxacin 750 MG tablet Commonly known as: LEVAQUIN     TAKE these medications   albuterol 108 (90 Base) MCG/ACT inhaler Commonly known as: VENTOLIN HFA Inhale 2 puffs into the lungs every 4 (four) hours as needed for wheezing or shortness of breath.   albuterol (2.5 MG/3ML) 0.083% nebulizer solution Commonly known as: PROVENTIL Take 3 mLs (2.5 mg total) by nebulization every 2 (two) hours as needed for wheezing or shortness of breath.   ALPRAZolam 1 MG tablet Commonly known as: XANAX Take 1 mg by mouth every morning. and one at lunch and 2 at bedtime.   budesonide-formoterol 80-4.5 MCG/ACT inhaler Commonly known as: Symbicort Inhale 2 puffs into the lungs 2 (two) times daily.   cetirizine 10 MG tablet Commonly known as: ZYRTEC Take 10 mg by mouth daily.   dicyclomine 10 MG capsule Commonly known as: BENTYL Take 1 capsule (10 mg total) by mouth every 6 (six) hours as needed for spasms.   FLUoxetine 20 MG capsule Commonly known as: PROZAC Take 3 capsules (60 mg total) by mouth daily. What changed: See the new instructions.  fluticasone 50 MCG/ACT nasal  spray Commonly known as: FLONASE Place 2 sprays into both nostrils daily.   guaiFENesin-dextromethorphan 100-10 MG/5ML syrup Commonly known as: ROBITUSSIN DM Take 10 mLs by mouth every 4 (four) hours as needed for cough.   montelukast 10 MG tablet Commonly known as: SINGULAIR Take 10 mg by mouth daily.   multivitamin with minerals Tabs tablet Take 1 tablet by mouth daily. Start taking on: October 09, 2019   oxyCODONE-acetaminophen 10-325 MG tablet Commonly known as: PERCOCET Take 1 tablet by mouth 3 (three) times daily as needed.   pantoprazole 40 MG tablet Commonly known as: Protonix Take 1 tablet (40 mg total) by mouth daily.   predniSONE 10 MG tablet Commonly known as: DELTASONE Take 1 tablet (10 mg total) by mouth daily with breakfast. Take 6 tablets today and then decrease by 1 tablet daily until none are left.   tiotropium 18 MCG inhalation capsule Commonly known as: Spiriva HandiHaler Place 1 capsule (18 mcg total) into inhaler and inhale daily.   triamcinolone ointment 0.5 % Commonly known as: KENALOG Apply 1 application topically 3 (three) times daily.   Wixela Inhub 250-50 MCG/DOSE Aepb Generic drug: Fluticasone-Salmeterol 1 puff 2 (two) times daily.      No Known Allergies Follow-up Information    [name], MD. Schedule an appointment as soon as possible for a visit in 10 day(s).   Specialty: Family Medicine Contact information: [address] [place] [address] [phone]           The results of significant diagnostics from this hospitalization (including imaging, microbiology, ancillary and laboratory) are listed below for reference.    Significant Diagnostic Studies: CT ABDOMEN PELVIS W CONTRAST  Result Date: 10/04/2019 CLINICAL DATA:  Recurrent vomiting, abdominal pain, nausea. COVID positive. EXAM: CT ABDOMEN AND PELVIS WITH CONTRAST TECHNIQUE: Multidetector CT imaging of the abdomen and pelvis was performed using the  standard protocol following bolus administration of intravenous contrast. CONTRAST:  42mL OMNIPAQUE IOHEXOL 300 MG/ML SOLN, OMNIPAQUE IOHEXOL 300 MG/ML SOLN COMPARISON:  None. FINDINGS: Lower chest: Mild patchy subpleural opacities in the bilateral lower lobes, reflecting multifocal pneumonia in this patient with known COVID. Trace bilateral pleural effusions. Hepatobiliary: Liver is within normal limits. Gallbladder is unremarkable. No intrahepatic or extrahepatic ductal dilatation. Pancreas: Within normal limits. Spleen: Within normal limits. Adrenals/Urinary Tract: Adrenal glands are within normal limits. Small cysts along the posterior interpolar left kidney measuring up to 12 mm (series 2/image 32). Right kidney is within normal limits. No hydronephrosis. Bladder is within normal limits. Stomach/Bowel: Stomach is within normal limits. Visualized bowel is unremarkable. Normal appendix (series 2/image 64). No colonic wall thickening or inflammatory changes. Vascular/Lymphatic: No evidence of abdominal aortic aneurysm. Atherosclerotic calcifications of the abdominal aorta and branch vessels. No suspicious abdominopelvic lymphadenopathy. Reproductive: Status post hysterectomy. No adnexal mass. Other: No abdominopelvic ascites. Musculoskeletal: Degenerative changes of the visualized thoracolumbar spine. IMPRESSION: Multifocal pneumonia at the lung bases in this patient with known COVID. Trace bilateral pleural effusions. Otherwise negative CT abdomen/pelvis. Electronically Signed   By: Charline Bills M.D.   On: 10/04/2019 16:46   DG Chest Portable 1 View  Result Date: 10/02/2019 CLINICAL DATA:  Shortness of breath. EXAM: PORTABLE CHEST 1 VIEW COMPARISON:  January 02, 2016. FINDINGS: The heart size and mediastinal contours are within normal limits. No pneumothorax or pleural effusion is noted. Mild bibasilar subsegmental atelectasis or infiltrates are noted. The visualized skeletal structures are  unremarkable. IMPRESSION: Mild bibasilar subsegmental atelectasis or  infiltrates are noted. Electronically Signed   By: Lupita Raider M.D.   On: 10/02/2019 09:17    Microbiology: Recent Results (from the past 240 hour(s))  Blood Culture (routine x 2)     Status: None   Collection Time: 10/02/19  8:50 AM   Specimen: Left Antecubital; Blood  Result Value Ref Range Status   Specimen Description   Final    LEFT ANTECUBITAL BOTTLES DRAWN AEROBIC AND ANAEROBIC   Special Requests Blood Culture adequate volume  Final   Culture   Final    NO GROWTH 5 DAYS Performed at [facility], [address], [place], [place] [address]    Report Status 10/07/2019 FINAL  Final  Blood Culture (routine x 2)     Status: None   Collection Time: 10/02/19  9:47 AM   Specimen: Right Antecubital; Blood  Result Value Ref Range Status   Specimen Description   Final    RIGHT ANTECUBITAL BOTTLES DRAWN AEROBIC AND ANAEROBIC   Special Requests Blood Culture adequate volume  Final   Culture   Final    NO GROWTH 5 DAYS Performed at [facility], [address] [place], [place] [address]    Report Status 10/07/2019 FINAL  Final  Culture, Urine     Status: None   Collection Time: 10/04/19  3:10 PM   Specimen: Urine, Random  Result Value Ref Range Status   Specimen Description   Final    URINE, RANDOM Performed at [facility], [address] [place], [place] [address]    Special Requests   Final    NONE Performed at [facility], [address], [place], [place] [address]    Culture   Final    NO GROWTH Performed at [facility], [address], [place], [place] [address]    Report Status 10/05/2019 FINAL  Final     Labs: Basic Metabolic Panel: Recent Labs  Lab 10/02/19 0820 10/03/19 0926 10/04/19 0520 10/05/19 0506 10/06/19 0324  NA 132* 141 140 141 138  K 3.4* 3.9 4.0 4.9 4.6  CL 97* 103 104 103 97*  CO2 25 24 26 29 30   GLUCOSE 107* 139* 115* 112* 114*  BUN 11 16 20 17 18   CREATININE  0.71 0.90 0.72 0.76 0.77  CALCIUM 8.2* 9.2 8.6* 8.8* 9.2  MG  --  2.5* 2.3 2.6*  --   PHOS  --  5.7* 3.8 3.5  --    Liver Function Tests: Recent Labs  Lab 10/02/19 0820 10/03/19 0926 10/04/19 0520 10/05/19 0506 10/06/19 0324  AST 26 31 25 22 20   ALT 19 22 19 19 19   ALKPHOS 109 121 89 84 80  BILITOT 0.5 0.5 0.4 0.4 0.6  PROT 6.9 8.0 6.8 7.0 7.3  ALBUMIN 3.2* 3.4* 3.0* 3.2* 3.4*   Recent Labs  Lab 10/04/19 1321  LIPASE 82*   CBC: Recent Labs  Lab 10/02/19 0820 10/03/19 0926 10/04/19 0520 10/05/19 0506 10/06/19 0324  WBC 8.1 8.6 13.1* 8.4 5.8  NEUTROABS 6.8 6.6 10.0* 5.9  --   HGB 11.4* 13.2 11.6* 13.0 13.8  HCT 34.9* 41.7 36.5 41.1 43.3  MCV 93.6 95.0 94.8 95.4 93.9  PLT 133* 201 203 211 255   CBG: Recent Labs  Lab 10/07/19 0755 10/07/19 1134 10/07/19 1701 10/07/19 2125 10/08/19 0749  GLUCAP 118* 140* 163* 88 107*    Signed:  12/05/19 MD.  [facility] 10/08/2019, 11:16 AM

## 2019-10-08 NOTE — Care Management Important Message (Signed)
Important Message  Patient Details  Name: Rebecca Hansen MRN: [id number] Date of Birth: [birth year]   Medicare Important Message Given:  Yes(Shannon, RN will deliver letter to patient due to contact precautions)     Corey Harold 10/08/2019, 3:13 PM

## 2021-02-23 NOTE — H&P (Signed)
Surgical History & Physical  Patient Name: Rebecca Hansen DOB: [birth year]  Surgery: Cataract extraction with intraocular lens implant phacoemulsification; Left Eye  Surgeon: [name] MD Surgery Date:  03/02/2021 Pre-Op Date:  02/23/2021  HPI: A 66 Yr. old female patient Pt referred by Dr. Charise Killian for cataract evaluation. The patient complains of difficulty when driving at night, which began 2-3 years ago. Both eyes are affected. The episode is gradual. The condition's severity is worsening. The complaint is associated with blurry vision. Pt state she has a kindle she has yet to use it due to poor vision at near. Symptoms are negatively affecting pt's quality of life. Pt states she has severe dry eye that worsens with allergy season. Using Restatsis BID OU. Pt states she she has noticed increase in floaters the past year. Pt denies any flashes of light or ocular pain or discomfort. HPI was performed by [name] .  Medical History: Dry Eyes Cataracts Anxiety disorder, Depressive disorder, Back proble... Lung Problems  Review of Systems Negative Allergic/Immunologic Negative Cardiovascular Negative Constitutional Negative Ear, Nose, Mouth & Throat Negative Endocrine Negative Eyes Negative Gastrointestinal Negative Genitourinary Negative Hemotologic/Lymphatic Negative Integumentary Negative Musculoskeletal Negative Neurological Negative Psychiatry Negative Respiratory  Social   Former smoker  Medication Restasis,  Pain reliever, Diet pills, Anti depressants, Nasal spray, OXYCODONE-ACETAMINOPHEN, ALPRAZOLAM, FLUOXETINE HYDROCHLORIDE, SPIRIVA, ALBUTEROL SULFATE HFA, TRIAMCINOLONE ACETONIDE, NAPROXEN, WIXELA INHUB, MONTELUKAST SODIUM,   Sx/Procedures Hysterectomy,   Drug Allergies   NKDA  History & Physical: Heent:  Cataract, Left eye NECK: supple without bruits LUNGS: lungs clear to auscultation CV: regular rate and rhythm Abdomen: soft and  non-tender  Impression & Plan: Assessment: 1.  COMBINED FORMS AGE RELATED CATARACT; Both Eyes (H25.813) 2.  BLEPHARITIS; Right Upper Lid, Right Lower Lid, Left Upper Lid, Left Lower Lid (H01.001, H01.002,H01.004,H01.005) 3.  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  DERMATOCHALASIS; Right Upper Lid, Left Upper Lid (H02.831, M84.132)  Plan: 1.  Cataract accounts for the patient's decreased vision. This visual impairment is not correctable with a tolerable change in glasses or contact lenses. Cataract surgery with an implantation of a new lens should significantly improve the visual and functional status of the patient. Discussed all risks, benefits, alternatives, and potential complications. Discussed the procedures and recovery. Patient desires to have surgery. A-scan ordered and performed today for intra-ocular lens calculations. The surgery will be performed in order to improve vision for driving, reading, and for eye examinations. Recommend phacoemulsification with intra-ocular lens. Recommend Dextenza for post-operative pain and inflammation. Left Eye worse - first. Dilates well - shugarcaine by protocol. Vision [employer]. in room. 2.  Recommend regular lid cleaning. 3.  Observe; Artificial tears as needed for irritation. 4.  Asymptomatic, recommend observation for now. Findings, prognosis and treatment options reviewed.

## 2021-02-27 ENCOUNTER — Other Ambulatory Visit: Payer: Self-pay

## 2021-02-27 ENCOUNTER — Encounter (HOSPITAL_COMMUNITY)
Admission: RE | Admit: 2021-02-27 | Discharge: 2021-02-27 | Disposition: A | Discharge status: Home / Self Care | Payer: Medicare Other | Source: Ambulatory Visit | Attending: Ophthalmology | Admitting: Ophthalmology

## 2021-02-27 HISTORY — DX: Anxiety disorder, unspecified: F41.9

## 2021-02-27 HISTORY — DX: Unspecified osteoarthritis, unspecified site: M19.90

## 2021-02-27 HISTORY — DX: Depression, unspecified: F32.A

## 2021-02-28 ENCOUNTER — Other Ambulatory Visit (HOSPITAL_COMMUNITY): Payer: Medicare Other

## 2021-02-28 ENCOUNTER — Encounter (HOSPITAL_COMMUNITY): Payer: Self-pay

## 2021-02-28 ENCOUNTER — Other Ambulatory Visit: Payer: Self-pay

## 2021-03-02 ENCOUNTER — Ambulatory Visit (HOSPITAL_COMMUNITY): Payer: Medicare Other | Admitting: Anesthesiology

## 2021-03-02 ENCOUNTER — Encounter (HOSPITAL_COMMUNITY): Payer: Self-pay | Admitting: Ophthalmology

## 2021-03-02 ENCOUNTER — Encounter (HOSPITAL_COMMUNITY)
Admission: RE | Disposition: A | Discharge status: Home / Self Care | Payer: Self-pay | Source: Home / Self Care | Attending: Ophthalmology

## 2021-03-02 ENCOUNTER — Ambulatory Visit (HOSPITAL_COMMUNITY)
Admission: RE | Admit: 2021-03-02 | Discharge: 2021-03-02 | Disposition: A | Discharge status: Home / Self Care | Payer: Medicare Other | Attending: Ophthalmology | Admitting: Ophthalmology

## 2021-03-02 DIAGNOSIS — H0100A Unspecified blepharitis right eye, upper and lower eyelids: Secondary | ICD-10-CM | POA: Insufficient documentation

## 2021-03-02 DIAGNOSIS — Z7951 Long term (current) use of inhaled steroids: Secondary | ICD-10-CM | POA: Diagnosis not present

## 2021-03-02 DIAGNOSIS — Z87891 Personal history of nicotine dependence: Secondary | ICD-10-CM | POA: Diagnosis not present

## 2021-03-02 DIAGNOSIS — Z79891 Long term (current) use of opiate analgesic: Secondary | ICD-10-CM | POA: Insufficient documentation

## 2021-03-02 DIAGNOSIS — H25812 Combined forms of age-related cataract, left eye: Secondary | ICD-10-CM | POA: Diagnosis present

## 2021-03-02 DIAGNOSIS — Z79899 Other long term (current) drug therapy: Secondary | ICD-10-CM | POA: Diagnosis not present

## 2021-03-02 DIAGNOSIS — H0100B Unspecified blepharitis left eye, upper and lower eyelids: Secondary | ICD-10-CM | POA: Diagnosis not present

## 2021-03-02 HISTORY — PX: CATARACT EXTRACTION W/PHACO: SHX586

## 2021-03-02 SURGERY — PHACOEMULSIFICATION, CATARACT, WITH IOL INSERTION
Anesthesia: Monitor Anesthesia Care | Site: Eye | Laterality: Left

## 2021-03-02 MED ORDER — SODIUM CHLORIDE 0.9% FLUSH
INTRAVENOUS | Status: DC | PRN
  Administered 2021-03-02: 5 mL via INTRAVENOUS

## 2021-03-02 MED ORDER — TRYPAN BLUE 0.06 % OP SOLN
OPHTHALMIC | Status: AC
  Filled 2021-03-02: qty 0.5

## 2021-03-02 MED ORDER — MIDAZOLAM HCL 2 MG/2ML IJ SOLN
INTRAMUSCULAR | Status: AC
  Filled 2021-03-02: qty 2

## 2021-03-02 MED ORDER — POVIDONE-IODINE 5 % OP SOLN
OPHTHALMIC | Status: DC | PRN
  Administered 2021-03-02: 1 via OPHTHALMIC

## 2021-03-02 MED ORDER — SODIUM HYALURONATE 10 MG/ML IO SOLUTION
PREFILLED_SYRINGE | INTRAOCULAR | Status: DC | PRN
  Administered 2021-03-02: 0.85 mL via INTRAOCULAR

## 2021-03-02 MED ORDER — TETRACAINE HCL 0.5 % OP SOLN
1.0000 [drp] | OPHTHALMIC | Status: AC | PRN
  Administered 2021-03-02 (×3): 1 [drp] via OPHTHALMIC

## 2021-03-02 MED ORDER — PHENYLEPHRINE HCL 2.5 % OP SOLN
1.0000 [drp] | OPHTHALMIC | Status: AC | PRN
  Administered 2021-03-02 (×3): 1 [drp] via OPHTHALMIC

## 2021-03-02 MED ORDER — STERILE WATER FOR IRRIGATION IR SOLN
Status: DC | PRN
  Administered 2021-03-02: 250 mL

## 2021-03-02 MED ORDER — LIDOCAINE HCL 3.5 % OP GEL
1.0000 "application " | Freq: Once | OPHTHALMIC | Status: AC
  Administered 2021-03-02: 1 via OPHTHALMIC

## 2021-03-02 MED ORDER — BSS IO SOLN
INTRAOCULAR | Status: DC | PRN
  Administered 2021-03-02: 15 mL via INTRAOCULAR

## 2021-03-02 MED ORDER — LIDOCAINE HCL (PF) 1 % IJ SOLN
INTRAOCULAR | Status: DC | PRN
  Administered 2021-03-02: 1 mL via OPHTHALMIC

## 2021-03-02 MED ORDER — SODIUM HYALURONATE 23MG/ML IO SOSY
PREFILLED_SYRINGE | INTRAOCULAR | Status: DC | PRN
  Administered 2021-03-02: 0.6 mL via INTRAOCULAR

## 2021-03-02 MED ORDER — NEOMYCIN-POLYMYXIN-DEXAMETH 3.5-10000-0.1 OP SUSP
OPHTHALMIC | Status: DC | PRN
  Administered 2021-03-02: 1 [drp] via OPHTHALMIC

## 2021-03-02 MED ORDER — TROPICAMIDE 1 % OP SOLN
1.0000 [drp] | OPHTHALMIC | Status: AC
  Administered 2021-03-02 (×3): 1 [drp] via OPHTHALMIC

## 2021-03-02 MED ORDER — MIDAZOLAM HCL 5 MG/5ML IJ SOLN
INTRAMUSCULAR | Status: DC | PRN
  Administered 2021-03-02: 1 mg via INTRAVENOUS

## 2021-03-02 MED ORDER — TRYPAN BLUE 0.06 % OP SOLN
OPHTHALMIC | Status: DC | PRN
  Administered 2021-03-02: 0.5 mL via INTRAOCULAR

## 2021-03-02 MED ORDER — EPINEPHRINE PF 1 MG/ML IJ SOLN
INTRAOCULAR | Status: DC | PRN
  Administered 2021-03-02: 500 mL

## 2021-03-02 SURGICAL SUPPLY — 11 items
CLOTH BEACON ORANGE TIMEOUT ST (SAFETY) ×2 IMPLANT
EYE SHIELD UNIVERSAL CLEAR (GAUZE/BANDAGES/DRESSINGS) ×2 IMPLANT
GLOVE SURG UNDER POLY LF SZ6.5 (GLOVE) ×2 IMPLANT
GLOVE SURG UNDER POLY LF SZ7 (GLOVE) ×2 IMPLANT
NEEDLE HYPO 18GX1.5 BLUNT FILL (NEEDLE) ×2 IMPLANT
PAD ARMBOARD 7.5X6 YLW CONV (MISCELLANEOUS) ×2 IMPLANT
SYR TB 1ML LL NO SAFETY (SYRINGE) ×2 IMPLANT
TAPE SURG TRANSPORE 1 IN (GAUZE/BANDAGES/DRESSINGS) ×1 IMPLANT
TAPE SURGICAL TRANSPORE 1 IN (GAUZE/BANDAGES/DRESSINGS) ×2
TECNIS IOL (Intraocular Lens) ×2 IMPLANT
WATER STERILE IRR 250ML POUR (IV SOLUTION) ×2 IMPLANT

## 2021-03-02 NOTE — Interval H&P Note (Signed)
History and Physical Interval Note:  03/02/2021 10:00 AM  Rebecca Hansen  has presented today for surgery, with the diagnosis of Nuclear sclerotic cataract - Left eye.  The various methods of treatment have been discussed with the patient and family. After consideration of risks, benefits and other options for treatment, the patient has consented to  Procedure(s) with comments: CATARACT EXTRACTION PHACO AND INTRAOCULAR LENS PLACEMENT (IOC) (Left) - left as a surgical intervention.  The patient's history has been reviewed, patient examined, no change in status, stable for surgery.  I have reviewed the patient's chart and labs.  Questions were answered to the patient's satisfaction.     [name]

## 2021-03-02 NOTE — Discharge Instructions (Signed)
Please discharge patient when stable, will follow up today with Dr. Annelie Boak at the [facility] [place] office immediately following discharge.  Leave shield in place until visit.  All paperwork with discharge instructions will be given at the office.  [facility] Address:  [address]  [place], [place] [address]             Monitored Anesthesia Care, Care After This sheet gives you information about how to care for yourself after your procedure. Your health care provider may also give you more specific instructions. If you have problems or questions, contact your health care provider. What can I expect after the procedure? After the procedure, it is common to have:  Tiredness.  Forgetfulness about what happened after the procedure.  Impaired judgment for important decisions.  Nausea or vomiting.  Some difficulty with balance. Follow these instructions at home: For the time period you were told by your health care provider:  Rest as needed.  Do not participate in activities where you could fall or become injured.  Do not drive or use machinery.  Do not drink alcohol.  Do not take sleeping pills or medicines that cause drowsiness.  Do not make important decisions or sign legal documents.  Do not take care of children on your own.      Eating and drinking  Follow the diet that is recommended by your health care provider.  Drink enough fluid to keep your urine pale yellow.  If you vomit: ? Drink water, juice, or soup when you can drink without vomiting. ? Make sure you have little or no nausea before eating solid foods. General instructions  Have a responsible adult stay with you for the time you are told. It is important to have someone help care for you until you are awake and alert.  Take over-the-counter and prescription medicines only as told by your health care provider.  If you have sleep apnea, surgery and certain  medicines can increase your risk for breathing problems. Follow instructions from your health care provider about wearing your sleep device: ? Anytime you are sleeping, including during daytime naps. ? While taking prescription pain medicines, sleeping medicines, or medicines that make you drowsy.  Avoid smoking.  Keep all follow-up visits as told by your health care provider. This is important. Contact a health care provider if:  You keep feeling nauseous or you keep vomiting.  You feel light-headed.  You are still sleepy or having trouble with balance after 24 hours.  You develop a rash.  You have a fever.  You have redness or swelling around the IV site. Get help right away if:  You have trouble breathing.  You have new-onset confusion at home. Summary  For several hours after your procedure, you may feel tired. You may also be forgetful and have poor judgment.  Have a responsible adult stay with you for the time you are told. It is important to have someone help care for you until you are awake and alert.  Rest as told. Do not drive or operate machinery. Do not drink alcohol or take sleeping pills.  Get help right away if you have trouble breathing, or if you suddenly become confused. This information is not intended to replace advice given to you by your health care provider. Make sure you discuss any questions you have with your health care provider. Document Revised: 06/01/2020 Document Reviewed: 08/19/2019 Elsevier Patient Education  2021 Elsevier Inc.  

## 2021-03-02 NOTE — Anesthesia Postprocedure Evaluation (Signed)
Anesthesia Post Note  Patient: Dorian A Mercado  Procedure(s) Performed: CATARACT EXTRACTION PHACO AND INTRAOCULAR LENS PLACEMENT (IOC) (Left Eye)  Patient location during evaluation: Short Stay Anesthesia Type: MAC Level of consciousness: awake and alert Pain management: pain level controlled Vital Signs Assessment: post-procedure vital signs reviewed and stable Respiratory status: spontaneous breathing Cardiovascular status: stable Postop Assessment: no apparent nausea or vomiting Anesthetic complications: no   No complications documented.   Last Vitals:  Vitals:   03/02/21 0924  BP: (!) 142/68  Pulse: 74  Resp: 15  Temp: 36.9 C  SpO2: 93%    Last Pain:  Vitals:   03/02/21 0924  TempSrc: Oral  PainSc: 0-No pain                 Alfred Eckley

## 2021-03-02 NOTE — Op Note (Signed)
Date of procedure: 03/02/21  Pre-operative diagnosis: Visually significant age-related combined cataract, Left Eye (H25.812)  Post-operative diagnosis: Visually significant age-related combined cataract, Left Eye (H25.812)  Procedure: Removal of cataract via phacoemulsification and insertion of intra-ocular lens Wynetta Emery and [employer] DCB00  +20.5D into the capsular bag of the Left Eye  Attending surgeon: Gerda Diss. Rashied Corallo, MD, MA  Anesthesia: MAC, Topical Akten  Complications: None  Estimated Blood Loss: <50m (minimal)  Specimens: None  Implants: As above  Indications:  Visually significant age-related cataract, Left Eye  Procedure:  The patient was seen and identified in the pre-operative area. The operative eye was identified and dilated.  The operative eye was marked.  Topical anesthesia was administered to the operative eye.     The patient was then to the operative suite and placed in the supine position.  A timeout was performed confirming the patient, procedure to be performed, and all other relevant information.   The patient's face was prepped and draped in the usual fashion for intra-ocular surgery.  A lid speculum was placed into the operative eye and the surgical microscope moved into place and focused.  An inferotemporal paracentesis was created using a 20 gauge paracentesis blade. Vision Blue was used to stain the anterior capsule. Shugarcaine was injected into the anterior chamber.  Viscoelastic was injected into the anterior chamber.  A temporal clear-corneal main wound incision was created using a 2.447mmicrokeratome.  A continuous curvilinear capsulorrhexis was initiated using an irrigating cystitome and completed using capsulorrhexis forceps.  Hydrodissection and hydrodeliniation were performed.  Viscoelastic was injected into the anterior chamber.  A phacoemulsification handpiece and a chopper as a second instrument were used to remove the nucleus and epinucleus. The  irrigation/aspiration handpiece was used to remove any remaining cortical material.   The capsular bag was reinflated with viscoelastic, checked, and found to be intact.  The intraocular lens was inserted into the capsular bag.  The irrigation/aspiration handpiece was used to remove any remaining viscoelastic.  The clear corneal wound and paracentesis wounds were then hydrated and checked with Weck-Cels to be watertight.  The lid-speculum was removed.  The drape was removed.  The patient's face was cleaned with a wet and dry 4x4.   Maxitrol was instilled in the eye. A clear shield was taped over the eye. The patient was taken to the post-operative care unit in good condition, having tolerated the procedure well.  Post-Op Instructions: [facility] and will receive all other orders and instructions.\

## 2021-03-02 NOTE — Anesthesia Preprocedure Evaluation (Signed)
Anesthesia Evaluation  Patient identified by MRN, date of birth, ID band Patient awake    Reviewed: Allergy & Precautions, H&P , NPO status , Patient's Chart, lab work & pertinent test results, reviewed documented beta blocker date and time   Airway Mallampati: II  TM Distance: >3 FB Neck ROM: full    Dental no notable dental hx.    Pulmonary pneumonia, resolved, COPD, Current Smoker,    Pulmonary exam normal breath sounds clear to auscultation       Cardiovascular Exercise Tolerance: Good negative cardio ROS   Rhythm:regular Rate:Normal     Neuro/Psych PSYCHIATRIC DISORDERS Anxiety Depression negative neurological ROS     GI/Hepatic negative GI ROS, Neg liver ROS,   Endo/Other  negative endocrine ROS  Renal/GU negative Renal ROS  negative genitourinary   Musculoskeletal   Abdominal   Peds  Hematology negative hematology ROS (+)   Anesthesia Other Findings   Reproductive/Obstetrics negative OB ROS                             Anesthesia Physical Anesthesia Plan  ASA: III  Anesthesia Plan: MAC   Post-op Pain Management:    Induction:   PONV Risk Score and Plan:   Airway Management Planned:   Additional Equipment:   Intra-op Plan:   Post-operative Plan:   Informed Consent: I have reviewed the patients History and Physical, chart, labs and discussed the procedure including the risks, benefits and alternatives for the proposed anesthesia with the patient or authorized representative who has indicated his/her understanding and acceptance.     Dental Advisory Given  Plan Discussed with: CRNA  Anesthesia Plan Comments:         Anesthesia Quick Evaluation

## 2021-03-02 NOTE — Transfer of Care (Signed)
Immediate Anesthesia Transfer of Care Note  Patient: Rebecca Hansen  Procedure(s) Performed: CATARACT EXTRACTION PHACO AND INTRAOCULAR LENS PLACEMENT (IOC) (Left Eye)  Patient Location: Short Stay  Anesthesia Type:MAC  Level of Consciousness: awake  Airway & Oxygen Therapy: Patient Spontanous Breathing  Post-op Assessment: Report given to RN  Post vital signs: Reviewed and stable  Last Vitals:  Vitals Value Taken Time  BP    Temp    Pulse    Resp    SpO2      Last Pain:  Vitals:   03/02/21 0924  TempSrc: Oral  PainSc: 0-No pain      Patients Stated Pain Goal: 6 (95/18/84 1660)  Complications: No complications documented.

## 2021-03-05 ENCOUNTER — Encounter (HOSPITAL_COMMUNITY): Payer: Self-pay | Admitting: Ophthalmology

## 2021-03-13 NOTE — H&P (Signed)
Surgical History & Physical  Patient Name: Rebecca Hansen DOB: 15-Jan-1951  Surgery: Cataract extraction with intraocular lens implant phacoemulsification; Right Eye  Surgeon: [name] MD Surgery Date:  03/30/2021 Pre-Op Date:  03/08/2021  HPI: A 89 Yr. old female patient 1. The patient complains of difficulty when driving at night, which began 2 years ago. The right eye is affected. The episode is gradual. The condition's severity increased since last visit. Symptoms occur when the patient is driving and outside. 2. The patient is returning after cataract post-op. The left eye is affected. Status post cataract post-op, which began 1 week ago: Since the last visit, the affected area is doing well. The patient's vision is improved. Patient is following medication instructions. HPI was performed by [name] .  Medical History: Dry Eyes Cataracts Anxiety disorder, Depressive disorder, Back proble... Lung Problems  Review of Systems Negative Allergic/Immunologic Negative Cardiovascular Negative Constitutional Negative Ear, Nose, Mouth & Throat Negative Endocrine Negative Eyes Negative Gastrointestinal Negative Genitourinary Negative Hemotologic/Lymphatic Negative Integumentary Negative Musculoskeletal Negative Neurological Negative Psychiatry Negative Respiratory  Social   Former smoker   Medication Restasis, Ilevro, Prednisolone,  Pain reliever, Diet pills, Anti depressants, Nasal spray, OXYCODONE-ACETAMINOPHEN, ALPRAZOLAM, FLUOXETINE HYDROCHLORIDE, SPIRIVA, ALBUTEROL SULFATE HFA, TRIAMCINOLONE ACETONIDE, NAPROXEN, WIXELA INHUB, MONTELUKAST SODIUM, Prednisolone acetate,   Sx/Procedures Phaco c IOL,  Hysterectomy,   Drug Allergies   NKDA  History & Physical: Heent:  Cataract, Right eye NECK: supple without bruits LUNGS: lungs clear to auscultation CV: regular rate and rhythm Abdomen: soft and non-tender  Impression & Plan: Assessment: 1.  CATARACT  EXTRACTION STATUS; Left Eye (Z98.42) 2.  COMBINED FORMS AGE RELATED CATARACT; Right Eye (H25.811)  Plan: 1.  1 week after cataract surgery. Doing well with improved vision and normal eye pressure. Call with any problems or concerns. Stop Vigamox. Continue Ilevro 1 drop 1x/day for 3 more weeks. Continue Pred Acetate 1 drop 2x/day for 3 more weeks.  2.  Dilates well - shugarcaine by protocol. Vision [employer]. in room. Cataract accounts for the patient's decreased vision. This visual impairment is not correctable with a tolerable change in glasses or contact lenses. Cataract surgery with an implantation of a new lens should significantly improve the visual and functional status of the patient. Discussed all risks, benefits, alternatives, and potential complications. Discussed the procedures and recovery. Patient desires to have surgery. A-scan ordered and performed today for intra-ocular lens calculations. The surgery will be performed in order to improve vision for driving, reading, and for eye examinations. Recommend phacoemulsification with intra-ocular lens. Recommend Dextenza for post-operative pain and inflammation. Right Eye. Surgery required to correct imbalance of vision.

## 2021-03-23 ENCOUNTER — Other Ambulatory Visit: Payer: Self-pay

## 2021-03-23 ENCOUNTER — Encounter (HOSPITAL_COMMUNITY)
Admit: 2021-03-23 | Discharge: 2021-03-23 | Disposition: A | Discharge status: Home / Self Care | Payer: Medicare Other | Attending: Ophthalmology | Admitting: Ophthalmology

## 2021-03-30 ENCOUNTER — Encounter (HOSPITAL_COMMUNITY)
Admission: RE | Disposition: A | Discharge status: Home / Self Care | Payer: Self-pay | Source: Home / Self Care | Attending: Ophthalmology

## 2021-03-30 ENCOUNTER — Encounter (HOSPITAL_COMMUNITY): Payer: Self-pay | Admitting: Ophthalmology

## 2021-03-30 ENCOUNTER — Ambulatory Visit (HOSPITAL_COMMUNITY): Payer: Medicare Other | Admitting: Anesthesiology

## 2021-03-30 ENCOUNTER — Ambulatory Visit (HOSPITAL_COMMUNITY)
Admission: RE | Admit: 2021-03-30 | Discharge: 2021-03-30 | Disposition: A | Discharge status: Home / Self Care | Payer: Medicare Other | Attending: Ophthalmology | Admitting: Ophthalmology

## 2021-03-30 DIAGNOSIS — Z87891 Personal history of nicotine dependence: Secondary | ICD-10-CM | POA: Insufficient documentation

## 2021-03-30 DIAGNOSIS — Z791 Long term (current) use of non-steroidal anti-inflammatories (NSAID): Secondary | ICD-10-CM | POA: Insufficient documentation

## 2021-03-30 DIAGNOSIS — Z9842 Cataract extraction status, left eye: Secondary | ICD-10-CM | POA: Insufficient documentation

## 2021-03-30 DIAGNOSIS — H25811 Combined forms of age-related cataract, right eye: Secondary | ICD-10-CM | POA: Insufficient documentation

## 2021-03-30 DIAGNOSIS — Z79899 Other long term (current) drug therapy: Secondary | ICD-10-CM | POA: Insufficient documentation

## 2021-03-30 HISTORY — PX: CATARACT EXTRACTION W/PHACO: SHX586

## 2021-03-30 SURGERY — PHACOEMULSIFICATION, CATARACT, WITH IOL INSERTION
Anesthesia: Monitor Anesthesia Care | Site: Eye | Laterality: Right

## 2021-03-30 MED ORDER — MIDAZOLAM HCL 2 MG/2ML IJ SOLN
INTRAMUSCULAR | Status: AC
  Filled 2021-03-30: qty 2

## 2021-03-30 MED ORDER — LIDOCAINE HCL 3.5 % OP GEL
1.0000 "application " | Freq: Once | OPHTHALMIC | Status: AC
  Administered 2021-03-30: 1 via OPHTHALMIC

## 2021-03-30 MED ORDER — BSS IO SOLN
INTRAOCULAR | Status: DC | PRN
  Administered 2021-03-30: 15 mL via INTRAOCULAR

## 2021-03-30 MED ORDER — POVIDONE-IODINE 5 % OP SOLN
OPHTHALMIC | Status: DC | PRN
  Administered 2021-03-30: 1 via OPHTHALMIC

## 2021-03-30 MED ORDER — SODIUM CHLORIDE 0.9% FLUSH
INTRAVENOUS | Status: DC | PRN
  Administered 2021-03-30: 5 mL via INTRAVENOUS

## 2021-03-30 MED ORDER — STERILE WATER FOR IRRIGATION IR SOLN
Status: DC | PRN
  Administered 2021-03-30: 250 mL

## 2021-03-30 MED ORDER — NEOMYCIN-POLYMYXIN-DEXAMETH 3.5-10000-0.1 OP SUSP
OPHTHALMIC | Status: DC | PRN
  Administered 2021-03-30: 1 [drp] via OPHTHALMIC

## 2021-03-30 MED ORDER — SODIUM HYALURONATE 23MG/ML IO SOSY
PREFILLED_SYRINGE | INTRAOCULAR | Status: DC | PRN
  Administered 2021-03-30: 0.6 mL via INTRAOCULAR

## 2021-03-30 MED ORDER — TROPICAMIDE 1 % OP SOLN
1.0000 [drp] | OPHTHALMIC | Status: AC
  Administered 2021-03-30 (×3): 1 [drp] via OPHTHALMIC

## 2021-03-30 MED ORDER — LIDOCAINE HCL (PF) 1 % IJ SOLN
INTRAOCULAR | Status: DC | PRN
  Administered 2021-03-30: 1 mL via OPHTHALMIC

## 2021-03-30 MED ORDER — EPINEPHRINE PF 1 MG/ML IJ SOLN
INTRAMUSCULAR | Status: AC
  Filled 2021-03-30: qty 2

## 2021-03-30 MED ORDER — PHENYLEPHRINE HCL 2.5 % OP SOLN
1.0000 [drp] | OPHTHALMIC | Status: AC | PRN
  Administered 2021-03-30 (×3): 1 [drp] via OPHTHALMIC

## 2021-03-30 MED ORDER — TRYPAN BLUE 0.06 % OP SOLN
OPHTHALMIC | Status: AC
  Filled 2021-03-30: qty 0.5

## 2021-03-30 MED ORDER — EPINEPHRINE PF 1 MG/ML IJ SOLN
INTRAOCULAR | Status: DC | PRN
  Administered 2021-03-30: 500 mL

## 2021-03-30 MED ORDER — MIDAZOLAM HCL 5 MG/5ML IJ SOLN
INTRAMUSCULAR | Status: DC | PRN
  Administered 2021-03-30: 2 mg via INTRAVENOUS

## 2021-03-30 MED ORDER — SODIUM HYALURONATE 10 MG/ML IO SOLUTION
PREFILLED_SYRINGE | INTRAOCULAR | Status: DC | PRN
  Administered 2021-03-30: .85 mL via INTRAOCULAR

## 2021-03-30 MED ORDER — TETRACAINE HCL 0.5 % OP SOLN
1.0000 [drp] | OPHTHALMIC | Status: AC | PRN
  Administered 2021-03-30 (×3): 1 [drp] via OPHTHALMIC

## 2021-03-30 SURGICAL SUPPLY — 11 items
CLOTH BEACON ORANGE TIMEOUT ST (SAFETY) ×3 IMPLANT
EYE SHIELD UNIVERSAL CLEAR (GAUZE/BANDAGES/DRESSINGS) ×3 IMPLANT
GLOVE SURG UNDER POLY LF SZ6.5 (GLOVE) ×3 IMPLANT
GLOVE SURG UNDER POLY LF SZ7 (GLOVE) ×3 IMPLANT
NEEDLE HYPO 18GX1.5 BLUNT FILL (NEEDLE) ×3 IMPLANT
PAD ARMBOARD 7.5X6 YLW CONV (MISCELLANEOUS) ×3 IMPLANT
SYR TB 1ML LL NO SAFETY (SYRINGE) ×3 IMPLANT
TAPE SURG TRANSPORE 1 IN (GAUZE/BANDAGES/DRESSINGS) ×1 IMPLANT
TAPE SURGICAL TRANSPORE 1 IN (GAUZE/BANDAGES/DRESSINGS) ×3
TECNIS IOL (Intraocular Lens) ×3 IMPLANT
WATER STERILE IRR 250ML POUR (IV SOLUTION) ×3 IMPLANT

## 2021-03-30 NOTE — Interval H&P Note (Signed)
History and Physical Interval Note:  03/30/2021 9:35 AM  Rebecca Hansen  has presented today for surgery, with the diagnosis of Nuclear sclerotic cataract - Right eye.  The various methods of treatment have been discussed with the patient and family. After consideration of risks, benefits and other options for treatment, the patient has consented to  Procedure(s) with comments: CATARACT EXTRACTION PHACO AND INTRAOCULAR LENS PLACEMENT (IOC) (Right) - right as a surgical intervention.  The patient's history has been reviewed, patient examined, no change in status, stable for surgery.  I have reviewed the patient's chart and labs.  Questions were answered to the patient's satisfaction.     [name]

## 2021-03-30 NOTE — Anesthesia Postprocedure Evaluation (Signed)
Anesthesia Post Note  Patient: Rebecca Hansen  Procedure(s) Performed: CATARACT EXTRACTION PHACO AND INTRAOCULAR LENS PLACEMENT (IOC) (Right: Eye)  Patient location during evaluation: Short Stay Anesthesia Type: MAC Level of consciousness: awake and alert Pain management: pain level controlled Vital Signs Assessment: post-procedure vital signs reviewed and stable Respiratory status: spontaneous breathing Cardiovascular status: blood pressure returned to baseline and stable Postop Assessment: no apparent nausea or vomiting Anesthetic complications: no   No notable events documented.   Last Vitals:  Vitals:   03/30/21 0918 03/30/21 1002  BP: 127/72 129/71  Pulse: 74 70  Resp: 12 13  Temp:  37 C  SpO2: 97% 97%    Last Pain:  Vitals:   03/30/21 1002  TempSrc: Oral  PainSc: 0-No pain                 Trudy Kory

## 2021-03-30 NOTE — Discharge Instructions (Addendum)
Please discharge patient when stable, will follow up today with Dr. Wrzosek at the [facility] office immediately following discharge.  Leave shield in place until visit.  All paperwork with discharge instructions will be given at the office.  [facility] Address:  [address]  [place], [place] [address]   PATIENT INSTRUCTIONS POST-ANESTHESIA  IMMEDIATELY FOLLOWING SURGERY:  Do not drive or operate machinery for the first twenty four hours after surgery.  Do not make any important decisions for twenty four hours after surgery or while taking narcotic pain medications or sedatives.  If you develop intractable nausea and vomiting or a severe headache please notify your doctor immediately.  FOLLOW-UP:  Please make an appointment with your surgeon as instructed. You do not need to follow up with anesthesia unless specifically instructed to do so.  WOUND CARE INSTRUCTIONS (if applicable):  Keep a dry clean dressing on the anesthesia/puncture wound site if there is drainage.  Once the wound has quit draining you may leave it open to air.  Generally you should leave the bandage intact for twenty four hours unless there is drainage.  If the epidural site drains for more than 36-48 hours please call the anesthesia department.  QUESTIONS?:  Please feel free to call your physician or the hospital operator if you have any questions, and they will be happy to assist you.       

## 2021-03-30 NOTE — Anesthesia Preprocedure Evaluation (Signed)
Anesthesia Evaluation  Patient identified by MRN, date of birth, ID band Patient awake    Reviewed: Allergy & Precautions, NPO status , Patient's Chart, lab work & pertinent test results  Airway Mallampati: II  TM Distance: >3 FB Neck ROM: Full    Dental  (+) Edentulous Upper, Edentulous Lower   Pulmonary pneumonia, resolved, COPD,  COPD inhaler and oxygen dependent, Current Smoker,    Pulmonary exam normal breath sounds clear to auscultation       Cardiovascular Exercise Tolerance: Good Normal cardiovascular exam Rhythm:Regular Rate:Normal     Neuro/Psych PSYCHIATRIC DISORDERS Anxiety Depression    GI/Hepatic negative GI ROS,   Endo/Other  negative endocrine ROS  Renal/GU negative Renal ROS     Musculoskeletal  (+) Arthritis ,   Abdominal   Peds  Hematology negative hematology ROS (+)   Anesthesia Other Findings   Reproductive/Obstetrics                            Anesthesia Physical Anesthesia Plan  ASA: 3  Anesthesia Plan: MAC   Post-op Pain Management:    Induction:   PONV Risk Score and Plan:   Airway Management Planned: Nasal Cannula and Natural Airway  Additional Equipment:   Intra-op Plan:   Post-operative Plan:   Informed Consent: I have reviewed the patients History and Physical, chart, labs and discussed the procedure including the risks, benefits and alternatives for the proposed anesthesia with the patient or authorized representative who has indicated his/her understanding and acceptance.       Plan Discussed with: CRNA and Surgeon  Anesthesia Plan Comments:        Anesthesia Quick Evaluation

## 2021-03-30 NOTE — Transfer of Care (Signed)
Immediate Anesthesia Transfer of Care Note  Patient: Rebecca Hansen  Procedure(s) Performed: CATARACT EXTRACTION PHACO AND INTRAOCULAR LENS PLACEMENT (IOC) (Right: Eye)  Patient Location: Short Stay  Anesthesia Type:MAC  Level of Consciousness: awake  Airway & Oxygen Therapy: Patient Spontanous Breathing  Post-op Assessment: Report given to RN  Post vital signs: Reviewed  Last Vitals:  Vitals Value Taken Time  BP    Temp    Pulse    Resp    SpO2      Last Pain:  Vitals:   03/30/21 0907  PainSc: 0-No pain         Complications: No notable events documented.

## 2021-03-30 NOTE — Op Note (Signed)
Date of procedure: 03/30/21  Pre-operative diagnosis:  Visually significant combined form age-related cataract, Right Eye (H25.811)  Post-operative diagnosis:  Visually significant combined form age-related cataract, Right Eye (H25.811)  Procedure: Removal of cataract via phacoemulsification and insertion of intra-ocular lens Wynetta Emery and [employer] DCB00  +20.0D into the capsular bag of the Right Eye  Attending surgeon: Gerda Diss. Deanglo Hissong, MD, MA  Anesthesia: MAC, Topical Akten  Complications: None  Estimated Blood Loss: <27m (minimal)  Specimens: None  Implants: As above  Indications:  Visually significant age-related cataract, Right Eye  Procedure:  The patient was seen and identified in the pre-operative area. The operative eye was identified and dilated.  The operative eye was marked.  Topical anesthesia was administered to the operative eye.     The patient was then to the operative suite and placed in the supine position.  A timeout was performed confirming the patient, procedure to be performed, and all other relevant information.   The patient's face was prepped and draped in the usual fashion for intra-ocular surgery.  A lid speculum was placed into the operative eye and the surgical microscope moved into place and focused.  A superotemporal paracentesis was created using a 20 gauge paracentesis blade.  Shugarcaine was injected into the anterior chamber.  Viscoelastic was injected into the anterior chamber.  A temporal clear-corneal main wound incision was created using a 2.468mmicrokeratome.  A continuous curvilinear capsulorrhexis was initiated using an irrigating cystitome and completed using capsulorrhexis forceps.  Hydrodissection and hydrodeliniation were performed.  Viscoelastic was injected into the anterior chamber.  A phacoemulsification handpiece and a chopper as a second instrument were used to remove the nucleus and epinucleus. The irrigation/aspiration handpiece was  used to remove any remaining cortical material.   The capsular bag was reinflated with viscoelastic, checked, and found to be intact.  The intraocular lens was inserted into the capsular bag.  The irrigation/aspiration handpiece was used to remove any remaining viscoelastic.  The clear corneal wound and paracentesis wounds were then hydrated and checked with Weck-Cels to be watertight.  The lid-speculum was removed.  The drape was removed.  The patient's face was cleaned with a wet and dry 4x4.   Maxitrol was instilled in the eye. A clear shield was taped over the eye. The patient was taken to the post-operative care unit in good condition, having tolerated the procedure well.  Post-Op Instructions: [facility] and will receive all other orders and instructions.

## 2021-04-03 ENCOUNTER — Encounter (HOSPITAL_COMMUNITY): Payer: Self-pay | Admitting: Ophthalmology

## 2021-07-06 IMAGING — CT CT ABD-PELV W/ CM
2 of 5 series · 15 of 46 positions shown, 17 images · IV contrast (Omnipaque or Isovue)
Comparison: None.

CLINICAL DATA: Recurrent vomiting, abdominal pain, nausea. COVID
positive.

EXAM:
CT ABDOMEN AND PELVIS WITH CONTRAST
TECHNIQUE: Multidetector CT imaging of the abdomen and pelvis was performed
using the standard protocol following bolus administration of
intravenous contrast.
CONTRAST:  30mL OMNIPAQUE IOHEXOL 300 MG/ML SOLN, 100mL OMNIPAQUE
IOHEXOL 300 MG/ML SOLN

[Series 2: axial st · axial · 0.81mm/px · z∈[+171,+556]mm · 12 of 93 slices shown, 14 images]
[im 8/93  soft-tissue]
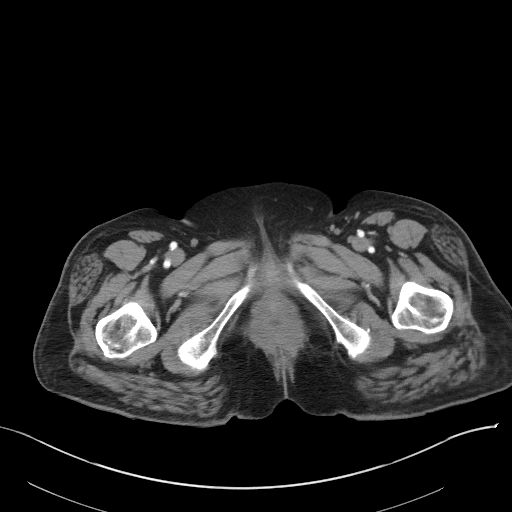
[im 8/93  bone]
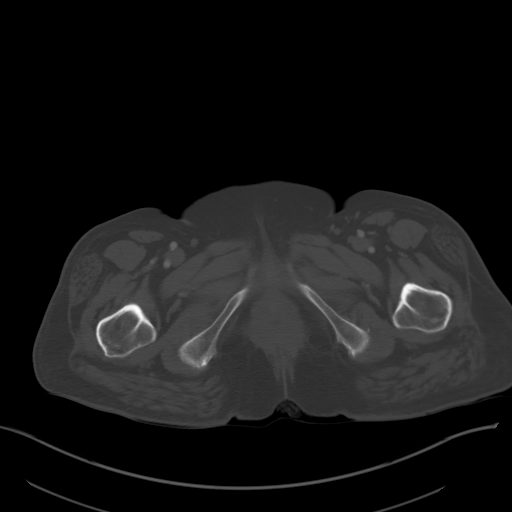
[im 15/93  soft-tissue]
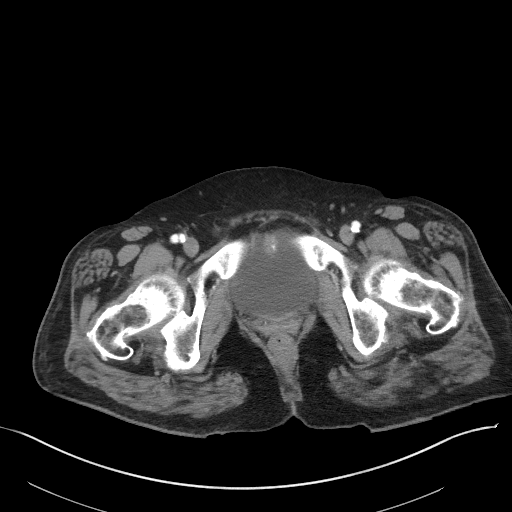
[im 22/93  soft-tissue]
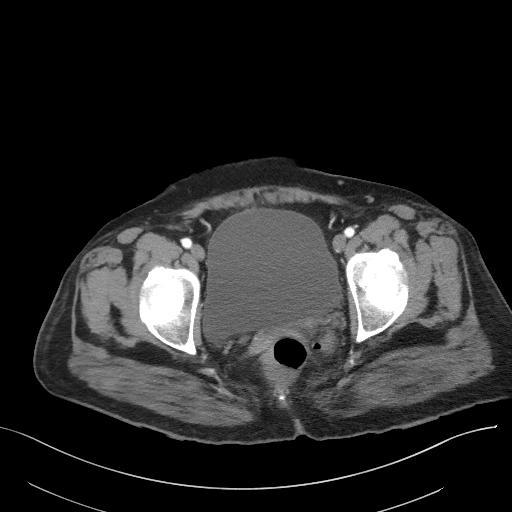
[im 29/93  soft-tissue]
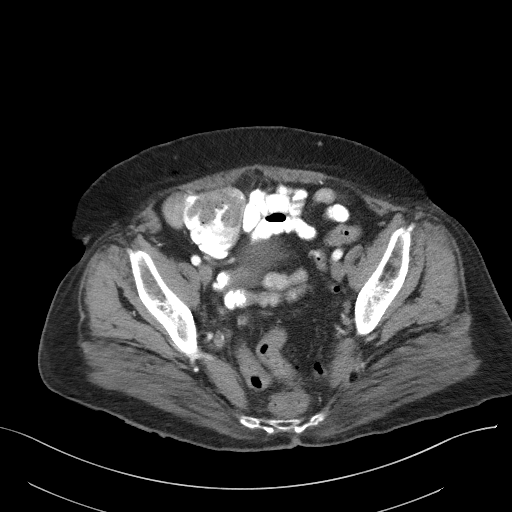
[im 36/93  soft-tissue]
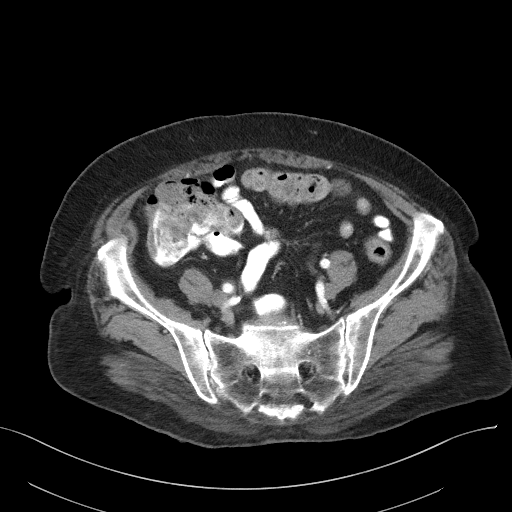
[im 43/93  soft-tissue]
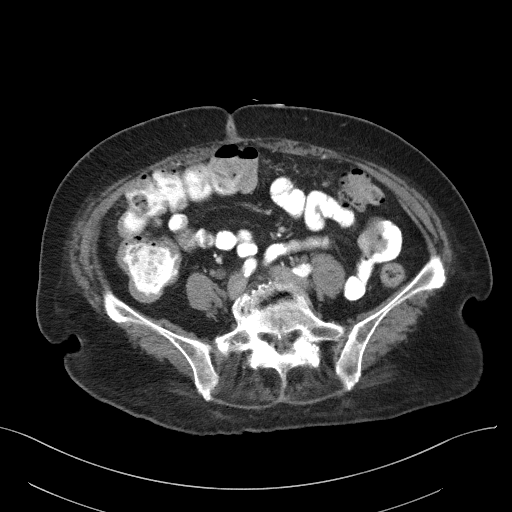
[im 50/93  soft-tissue]
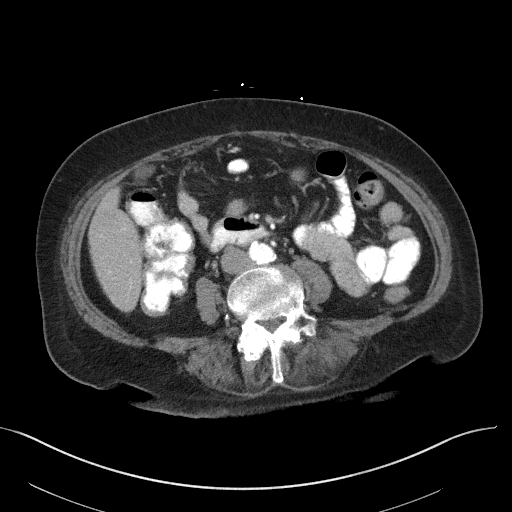
[im 57/93  soft-tissue]
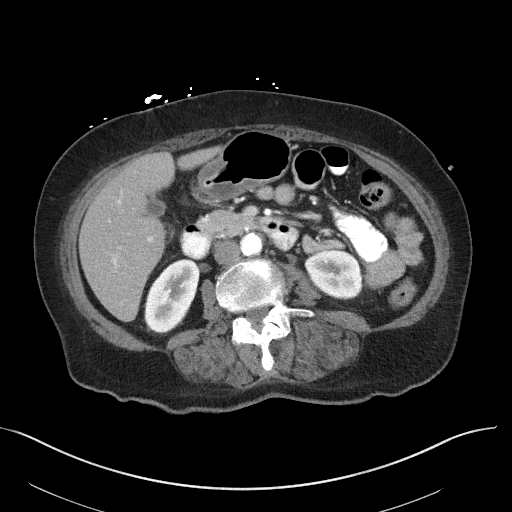
[im 64/93  soft-tissue]
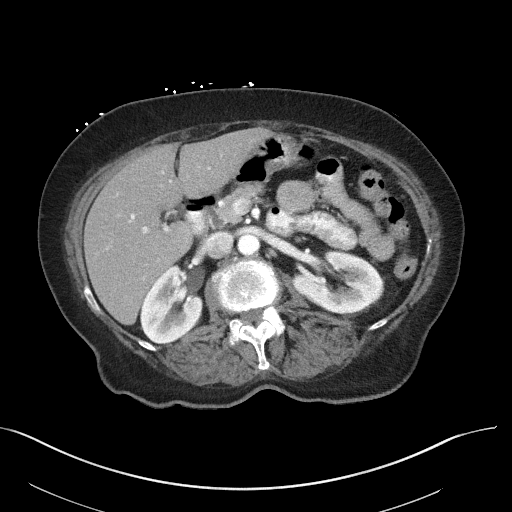
[im 64/93  bone]
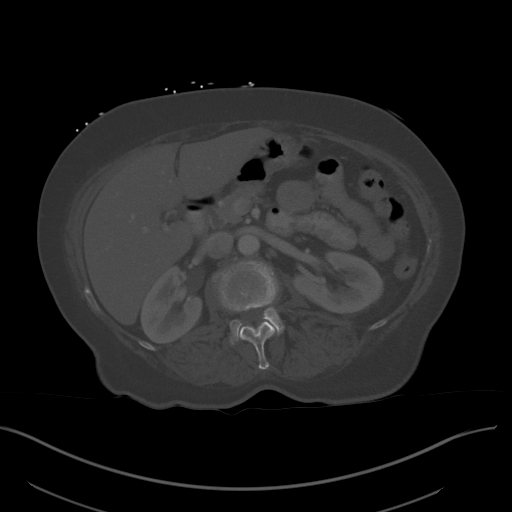
[im 71/93  soft-tissue]
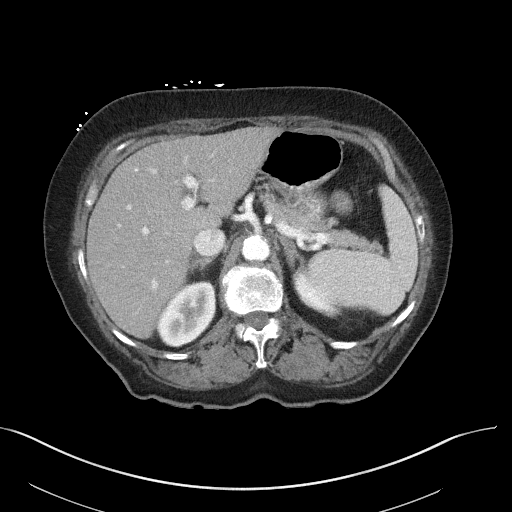
[im 78/93  soft-tissue]
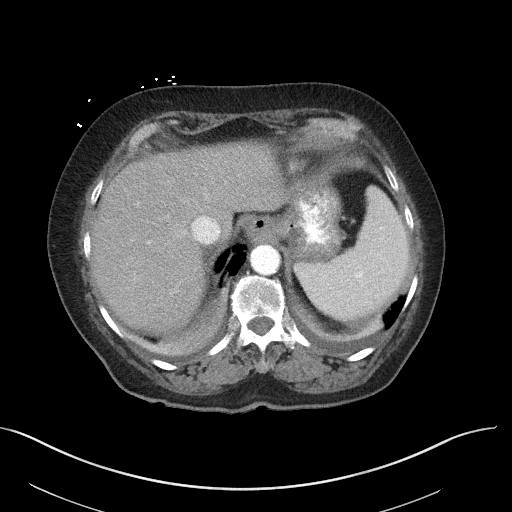
[im 85/93  soft-tissue]
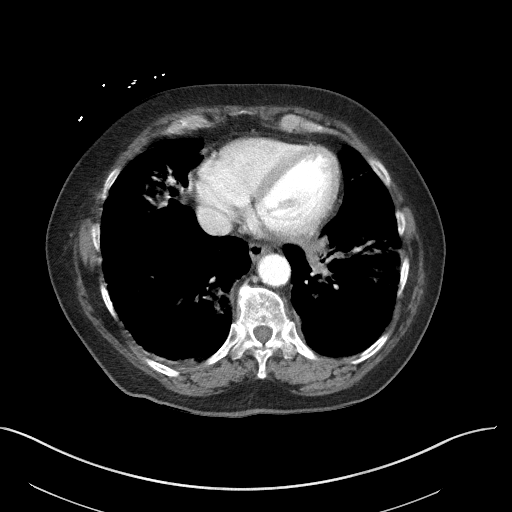

[Series 6: coronal st · coronal · 0.81mm/px · 3 of 115 slices shown]
[im 39/115  soft-tissue]
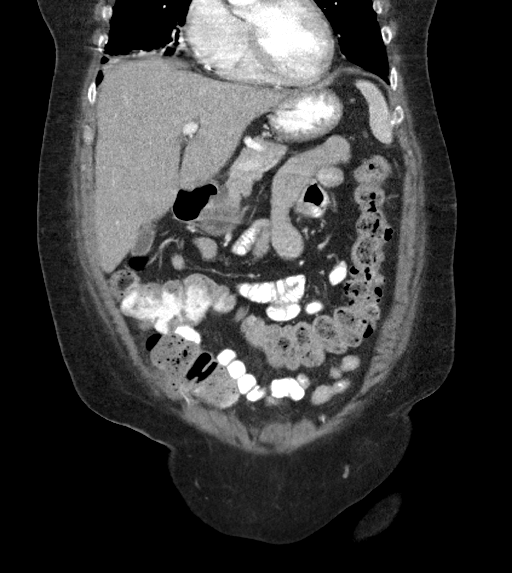
[im 51/115  soft-tissue]
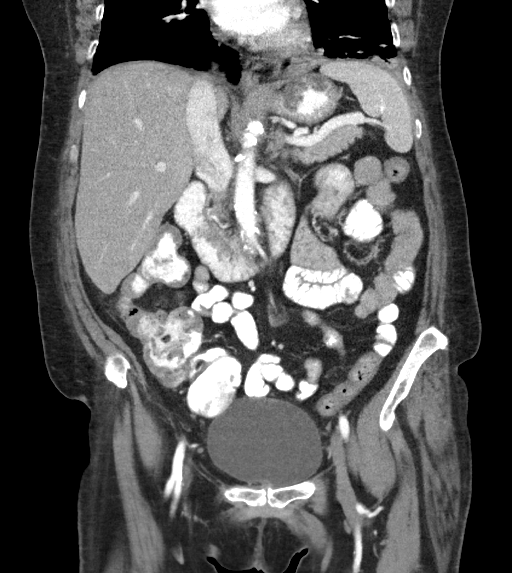
[im 64/115  soft-tissue]
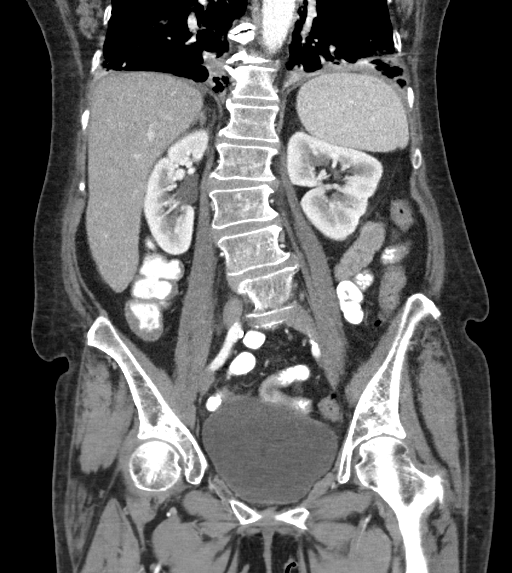

[15 of 46 positions shown; findings below may reference images not displayed]

FINDINGS: Lower chest: Mild patchy subpleural opacities in the bilateral lower
lobes, reflecting multifocal pneumonia in this patient with known
COVID. Trace bilateral pleural effusions.

Hepatobiliary: Liver is within normal limits.

Gallbladder is unremarkable. No intrahepatic or extrahepatic ductal
dilatation.

Pancreas: Within normal limits.

Spleen: Within normal limits.

Adrenals/Urinary Tract: Adrenal glands are within normal limits.

Small cysts along the posterior interpolar left kidney measuring up
to 12 mm (series 2/image 32). Right kidney is within normal limits.
No hydronephrosis.

Bladder is within normal limits.

Stomach/Bowel: Stomach is within normal limits.

Visualized bowel is unremarkable.

Normal appendix (series 2/image 64).

No colonic wall thickening or inflammatory changes.

Vascular/Lymphatic: No evidence of abdominal aortic aneurysm.

Atherosclerotic calcifications of the abdominal aorta and branch
vessels.

No suspicious abdominopelvic lymphadenopathy.

Reproductive: Status post hysterectomy.

No adnexal mass.

Other: No abdominopelvic ascites.

Musculoskeletal: Degenerative changes of the visualized
thoracolumbar spine.
IMPRESSION: Multifocal pneumonia at the lung bases in this patient with known
COVID. Trace bilateral pleural effusions.

Otherwise negative CT abdomen/pelvis.

## 2022-07-12 ENCOUNTER — Other Ambulatory Visit (HOSPITAL_COMMUNITY): Payer: Self-pay | Admitting: Family Medicine

## 2022-07-12 DIAGNOSIS — Z1231 Encounter for screening mammogram for malignant neoplasm of breast: Secondary | ICD-10-CM

## 2022-07-12 DIAGNOSIS — Z780 Asymptomatic menopausal state: Secondary | ICD-10-CM

## 2022-07-18 ENCOUNTER — Encounter: Payer: Self-pay | Admitting: *Deleted

## 2022-12-30 ENCOUNTER — Encounter: Payer: Self-pay | Admitting: *Deleted

## 2023-10-06 ENCOUNTER — Ambulatory Visit (HOSPITAL_COMMUNITY): Payer: 59

## 2023-10-22 ENCOUNTER — Ambulatory Visit (HOSPITAL_COMMUNITY): Payer: 59

## 2023-10-30 ENCOUNTER — Ambulatory Visit (HOSPITAL_COMMUNITY): Payer: 59

## 2024-08-06 ENCOUNTER — Encounter (HOSPITAL_COMMUNITY): Payer: Self-pay

## 2024-08-06 ENCOUNTER — Ambulatory Visit (HOSPITAL_COMMUNITY): Attending: Family Medicine

## 2024-08-06 DIAGNOSIS — R2689 Other abnormalities of gait and mobility: Secondary | ICD-10-CM | POA: Insufficient documentation

## 2024-08-06 DIAGNOSIS — R293 Abnormal posture: Secondary | ICD-10-CM | POA: Insufficient documentation

## 2024-08-06 DIAGNOSIS — M5459 Other low back pain: Secondary | ICD-10-CM | POA: Insufficient documentation

## 2024-08-06 NOTE — Therapy (Signed)
 OUTPATIENT PHYSICAL THERAPY THORACOLUMBAR EVALUATION   Patient Name: Rebecca Hansen MRN: [id number] DOB:26-Dec-1950, 73 y.o., female Today's Date: 08/06/2024  END OF SESSION:  PT End of Session - 08/06/24 1331     Visit Number 1    Number of Visits 12    Date for Recertification  10/29/24    Authorization Type [employer] Medicare    Authorization Time Period seeking authroization    Progress Note Due on Visit 10    PT Start Time 1331    PT Stop Time 1413    PT Time Calculation (min) 42 min    Activity Tolerance Patient tolerated treatment well    Behavior During Therapy WFL for tasks assessed/performed          Past Medical History:  Diagnosis Date   Anxiety    Arthritis    COPD (chronic obstructive pulmonary disease) (HCC)    Depression    Past Surgical History:  Procedure Laterality Date   CATARACT EXTRACTION W/PHACO Left 03/02/2021   Procedure: CATARACT EXTRACTION PHACO AND INTRAOCULAR LENS PLACEMENT (IOC);  Surgeon: Harrie Agent, MD;  Location: [facility];  Service: Ophthalmology;  Laterality: Left;  CDE: 1136   CATARACT EXTRACTION W/PHACO Right 03/30/2021   Procedure: CATARACT EXTRACTION PHACO AND INTRAOCULAR LENS PLACEMENT (IOC);  Surgeon: Harrie Agent, MD;  Location: [facility];  Service: Ophthalmology;  Laterality: Right;  CDE: 13.98   HYSTEROSCOPY     Patient Active Problem List   Diagnosis Date Noted   Intractable vomiting 10/04/2019   Lobar pneumonia 10/03/2019   Thrombocytopenia 10/03/2019   Chronic pain syndrome 10/03/2019   Acute respiratory disease due to COVID-19 virus 10/02/2019   Pneumonia due to COVID-19 virus 10/02/2019   COPD (chronic obstructive pulmonary disease) (HCC) 10/02/2019   Acute on chronic respiratory failure with hypoxia due to Covid pneumonia 10/02/2019   Hyperglycemia, drug-induced 06/20/2015   Abnormal thyroid function test 06/20/2015   Tobacco abuse 06/19/2015   Back pain 06/19/2015   Anxiety 06/19/2015    PCP: Nemiah Kemps,  MD  REFERRING PROVIDER: Hyacinth Honey, NP   REFERRING DIAG: back pain   Rationale for Evaluation and Treatment: Rehabilitation  THERAPY DIAG:  Other low back pain  Abnormal posture  Other abnormalities of gait and mobility  ONSET DATE: March-April 2025  SUBJECTIVE:                                                                                                                                                                                           SUBJECTIVE STATEMENT: Patient reports that her back has been bothering her for about a year, but she initially injured her  back years ago when she was in her 70's. She got a new mattress in late March or early April and she has been having pain since then. She has changed how she sleeps as she no longer sleeps with a pillow between her knees which she used for years. She likes to walk, but she cannot get anyone to walk with her and she does not want to walk alone. However, she laid in bed for about a month after her fiance died earlier this year. She fell off a ladder about 1.5 months ago. She ended up hitting her back on the chair behind her and her arm on the dresser.   PERTINENT HISTORY:  Anxiety, OA, COPD, history of COVID-19, and history of depression    PAIN:  Are you having pain? Yes: NPRS scale: Current: 3/10 Best: 3/10 Worst: 10+/10 Pain location: low back  Pain description: spasms, sharp, stabbing, constant Aggravating factors: bending over,   Relieving factors: medication    PRECAUTIONS: Fall  RED FLAGS: None   WEIGHT BEARING RESTRICTIONS: No  FALLS:  Has patient fallen in last 6 months? Yes. Number of falls 1  LIVING ENVIRONMENT: Lives with: lives with their daughter Lives in: House/apartment Stairs: Yes: Internal: 15 steps; on left going up; step to pattern Has following equipment at home: shower chair  OCCUPATION: retired  PLOF: Independent  PATIENT GOALS: reduced pain, improved mobility, and be able to  stand upright  NEXT MD VISIT: none scheduled, but patient is unsure  OBJECTIVE:  Note: Objective measures were completed at Evaluation unless otherwise noted.  PATIENT SURVEYS:  Modified Oswestry:  MODIFIED OSWESTRY DISABILITY SCALE  Date: 08/06/24 Score  Pain intensity 3 =  Pain medication provides me with moderate relief from pain.  2. Personal care (washing, dressing, etc.) 1 =  I can take care of myself normally, but it increases my pain.  3. Lifting 5 =  I cannot lift or carry anything at all.  4. Walking 3 =  Pain prevents me from walking more than  mile.  5. Sitting 2 =  Pain prevents me from sitting more than 1 hour.  6. Standing 2 =  Pain prevents me from standing more than 1 hour  7. Sleeping 2 =  Even when I take pain medication, I sleep less than 6 hours  8. Social Life 4 =  Pain has restricted my social life to my home  9. Traveling 2 =  My pain restricts my travel over 2 hours.  10. Employment/ Homemaking 2 = I can perform most of my homemaking/job duties, but pain prevents me from performing more physically stressful activities (eg, lifting, vacuuming).  Total 26/50   Interpretation of scores: Score Category Description  0-20% Minimal Disability The patient can cope with most living activities. Usually no treatment is indicated apart from advice on lifting, sitting and exercise  21-40% Moderate Disability The patient experiences more pain and difficulty with sitting, lifting and standing. Travel and social life are more difficult and they may be disabled from work. Personal care, sexual activity and sleeping are not grossly affected, and the patient can usually be managed by conservative means  41-60% Severe Disability Pain remains the main problem in this group, but activities of daily living are affected. These patients require a detailed investigation  61-80% Crippled Back pain impinges on all aspects of the patient's life. Positive intervention is required  81-100%  Bed-bound These patients are either bed-bound or exaggerating their symptoms  Bluford BRAVO,  Scantlebury A, Booth A, et al. Surgery versus conservative management of stable thoracolumbar fracture: the PRESTO feasibility RCT. [place] ([place]): [employer]; 2021 Nov. [facility] Technology Assessment, No. 25.62.) Appendix 3, Oswestry Disability Index category descriptors. Available from: Findjewelers.cz  Minimally Clinically Important Difference (MCID) = 12.8%  COGNITION: Overall cognitive status: Within functional limits for tasks assessed     SENSATION: Patient reports intermittent tingling in her back, but none currently.  POSTURE: rounded shoulders, forward head, decreased lumbar lordosis, increased thoracic kyphosis, and flexed trunk   LUMBAR ROM:   AROM eval  Flexion 50% limited  Extension Unable to achieve neutral due to pain   Right lateral flexion   Left lateral flexion   Right rotation 75% limited; familiar pain   Left rotation 75% limited; familiar pain    (Blank rows = not tested)  LOWER EXTREMITY ROM:  WFL for activities assessed  LOWER EXTREMITY MMT:    MMT Right eval Left eval  Hip flexion 3+/5; pulling 3+/5  Hip extension    Hip abduction    Hip adduction    Hip internal rotation    Hip external rotation    Knee flexion 4+/5; familiar pain  4+/5; familiar pain (not as bad as right side)  Knee extension 4+/5 5/5  Ankle dorsiflexion 4-/5 4-/5  Ankle plantarflexion    Ankle inversion    Ankle eversion     (Blank rows = not tested)  FUNCTIONAL TESTS:  5 times sit to stand: 25.43 seconds; intermittent use of armrests Timed up and go (TUG): 15.38 seconds 2 minute walk test: 285 feet; reported bilateral leg pain, but no back pain  GAIT: Distance walked: 285 feet Assistive device utilized: None Level of assistance: Complete Independence Comments: flexed trunk with poor stride length and gait speed  TREATMENT DATE:                                                                                                                                08/06/24: PT evaluation, patient education, and HEP   PATIENT EDUCATION:  Education details: Plan of care, prognosis, objective findings, HEP, and goals for physical therapy Person educated: Patient Education method: Explanation, Demonstration, and Handouts Education comprehension: verbalized understanding and returned demonstration  HOME EXERCISE PROGRAM: Access Code: [email or web address] URL: [email or web address] Date: 08/06/2024 Prepared by: Lacinda Fass  Exercises - Supine Hip Adduction Isometric with Ball  - 1 x daily - 7 x weekly - 3 sets - 10 reps - 5 seconds hold - Seated Hip Adduction Isometrics with Ball  - 1 x daily - 7 x weekly - 3 sets - 10 reps - 5 seconds hold - Supine Bilateral Hamstring Sets  - 1 x daily - 7 x weekly - 3 sets - 10 reps - 5 seconds hold - Seated Hamstring Set  - 1 x daily - 7 x weekly - 3 sets - 10 reps - 5 seconds hold  ASSESSMENT:  CLINICAL IMPRESSION:  Patient is a 73 y.o. female who was seen today for physical therapy evaluation and treatment for chronic low back pain.  She presented with low to moderate pain severity and irritability with bilateral hamstring strength testing and lumbar extension and rotation reproducing her familiar symptoms.  She exhibited significant deficits in lumbar active range of motion which result in her abnormal posture and gait deviations.  She is at an elevated fall risk as evidenced by her timed up and go and 5 times sit to stand times in addition to her history of falling.  She was provided a HEP and she was able to properly demonstrate these interventions.  She reported feeling comfortable completing these interventions at home.  Recommend that she continue with skilled physical therapy to address her impairments to maximize her safety and functional mobility.  OBJECTIVE IMPAIRMENTS: Abnormal gait,  decreased activity tolerance, decreased balance, decreased endurance, decreased mobility, difficulty walking, decreased ROM, decreased strength, hypomobility, postural dysfunction, and pain.   ACTIVITY LIMITATIONS: carrying, lifting, bending, standing, transfers, and locomotion level  PARTICIPATION LIMITATIONS: cleaning, laundry, shopping, and community activity  PERSONAL FACTORS: Past/current experiences, Time since onset of injury/illness/exacerbation, and 3+ comorbidities: Anxiety, OA, COPD, history of COVID-19, and history of depression   are also affecting patient's functional outcome.   REHAB POTENTIAL: Fair    CLINICAL DECISION MAKING: Evolving/moderate complexity  EVALUATION COMPLEXITY: Moderate   GOALS: Goals reviewed with patient? Yes  SHORT TERM GOALS: Target date: 08/27/24  Patient will be independent with her initial HEP. Baseline: Goal status: INITIAL  2.  Patient will improve her modified ODI to 15/50 or better for improved perceived function with her daily activities. Baseline:  Goal status: INITIAL  3.  Patient will be able to demonstrate improved lumbar extension to neutral for improved awareness of her surroundings. Baseline:  Goal status: INITIAL  4.  Patient will be able to complete her daily activities without her familiar pain exceeding 8/10. Baseline:  Goal status: INITIAL  LONG TERM GOALS: Target date: 09/17/24  Patient will be independent with her advanced HEP. Baseline:  Goal status: INITIAL  2.  Patient will improve her timed up and go time to 12 seconds or less to reduce her risk of falling. Baseline:  Goal status: INITIAL  3.  Patient will improve her 2-minute walk test distance by at least 50 feet for improved community mobility. Baseline:  Goal status: INITIAL  4.  Patient will improve her 5 times sit to stand time to 12 seconds or less for improved lower extremity power. Baseline:  Goal status: INITIAL  5.  Patient will be able to  complete her daily activities without her familiar pain exceeding 6/10. Baseline:  Goal status: INITIAL  PLAN:  PT FREQUENCY: 2x/week  PT DURATION: 6 weeks  PLANNED INTERVENTIONS: 97164- PT Re-evaluation, 97750- Physical Performance Testing, 97110-Therapeutic exercises, 97530- Therapeutic activity, 97112- Neuromuscular re-education, 97535- Self Care, 02859- Manual therapy, [phone]- Gait training, [phone]- Electrical stimulation (unattended), Patient/Family education, Balance training, Stair training, Taping, Joint mobilization, Spinal mobilization, Cryotherapy, and Moist heat.  PLAN FOR NEXT SESSION: Postural reeducation, lumbar and lower extremity strengthening, and balance interventions   Lacinda JAYSON Fass, PT 08/06/2024, 5:47 PM   [employer] Medicare Auth Request Information Treatment Start Date: 08/06/24  Date of referral: 07/07/24 Referring provider: Hyacinth Honey, NP  Referring diagnosis (ICD 10)? M54.50  Treatment diagnosis (ICD 10)? (if different than referring diagnosis) M54.59, R29.3, R26.89   What was this (referring dx) caused by? Ongoing Issue  Lysle of Condition:  Chronic (continuous duration > 3 months)   Laterality: Both  Current Functional Measure Score: Back Index 26/50  Objective measurements identify impairments when they are compared to normal values, the uninvolved extremity, and prior level of function.  [x]  Yes  []  No  Objective assessment of functional ability: Severe functional limitations   Briefly describe symptoms: constant sharp, stabbing low back pain  How did symptoms start: lifting injury in my 30's   Average pain intensity:  Last 24 hours: 6/10  Past week: 6/10  How often does the pt experience symptoms? Constantly  How much have the symptoms interfered with usual daily activities? Quite a bit  How has condition changed since care began at this facility? NA - initial visit  In general, how is the patients overall health? Fair   BACK PAIN  (STarT Back Screening Tool) No

## 2024-08-12 ENCOUNTER — Ambulatory Visit (HOSPITAL_COMMUNITY)

## 2024-08-12 ENCOUNTER — Encounter (HOSPITAL_COMMUNITY): Payer: Self-pay

## 2024-08-12 DIAGNOSIS — R2689 Other abnormalities of gait and mobility: Secondary | ICD-10-CM

## 2024-08-12 DIAGNOSIS — R293 Abnormal posture: Secondary | ICD-10-CM

## 2024-08-12 DIAGNOSIS — M5459 Other low back pain: Secondary | ICD-10-CM

## 2024-08-12 NOTE — Therapy (Addendum)
 OUTPATIENT PHYSICAL THERAPY THORACOLUMBAR TREATMENT   Patient Name: Rebecca Hansen MRN: [id number] DOB:22-Sep-1951, 73 y.o., female Today's Date: 08/12/2024  END OF SESSION:  PT End of Session - 08/12/24 1109     Visit Number 2    Number of Visits 12    Date for Recertification  10/29/24    Authorization Type [employer]    Authorization Time Period 12 approved, 08/06/24-09/17/24    Authorization - Visit Number 1    Authorization - Number of Visits 12    Progress Note Due on Visit 10    PT Start Time 1110    PT Stop Time 1157    PT Time Calculation (min) 47 min    Activity Tolerance Patient tolerated treatment well    Behavior During Therapy WFL for tasks assessed/performed          Past Medical History:  Diagnosis Date   Anxiety    Arthritis    COPD (chronic obstructive pulmonary disease) (HCC)    Depression    Past Surgical History:  Procedure Laterality Date   CATARACT EXTRACTION W/PHACO Left 03/02/2021   Procedure: CATARACT EXTRACTION PHACO AND INTRAOCULAR LENS PLACEMENT (IOC);  Surgeon: Harrie Agent, MD;  Location: [facility];  Service: Ophthalmology;  Laterality: Left;  CDE: 1136   CATARACT EXTRACTION W/PHACO Right 03/30/2021   Procedure: CATARACT EXTRACTION PHACO AND INTRAOCULAR LENS PLACEMENT (IOC);  Surgeon: Harrie Agent, MD;  Location: [facility];  Service: Ophthalmology;  Laterality: Right;  CDE: 13.98   HYSTEROSCOPY     Patient Active Problem List   Diagnosis Date Noted   Intractable vomiting 10/04/2019   Lobar pneumonia 10/03/2019   Thrombocytopenia 10/03/2019   Chronic pain syndrome 10/03/2019   Acute respiratory disease due to COVID-19 virus 10/02/2019   Pneumonia due to COVID-19 virus 10/02/2019   COPD (chronic obstructive pulmonary disease) (HCC) 10/02/2019   Acute on chronic respiratory failure with hypoxia due to Covid pneumonia 10/02/2019   Hyperglycemia, drug-induced 06/20/2015   Abnormal thyroid function test 06/20/2015   Tobacco abuse 06/19/2015    Back pain 06/19/2015   Anxiety 06/19/2015    PCP: Nemiah Kemps, MD  REFERRING PROVIDER: Hyacinth Honey, NP   REFERRING DIAG: back pain   Rationale for Evaluation and Treatment: Rehabilitation  THERAPY DIAG:  Other low back pain  Abnormal posture  Other abnormalities of gait and mobility  ONSET DATE: March-April 2025  SUBJECTIVE:                                                                                                                                                                                           SUBJECTIVE STATEMENT:  Pt states she was sore in her legs after last session. Pt states she has been doing the HEP, and felt pretty good. Pt states back pain is a 5/10 this date.   Eval: Patient reports that her back has been bothering her for about a year, but she initially injured her back years ago when she was in her 18's. She got a new mattress in late March or early April and she has been having pain since then. She has changed how she sleeps as she no longer sleeps with a pillow between her knees which she used for years. She likes to walk, but she cannot get anyone to walk with her and she does not want to walk alone. However, she laid in bed for about a month after her fiance died earlier this year. She fell off a ladder about 1.5 months ago. She ended up hitting her back on the chair behind her and her arm on the dresser.   PERTINENT HISTORY:  Anxiety, OA, COPD, history of COVID-19, and history of depression    PAIN:  Are you having pain? Yes: NPRS scale: Current: 3/10 Best: 3/10 Worst: 10+/10 Pain location: low back  Pain description: spasms, sharp, stabbing, constant Aggravating factors: bending over,   Relieving factors: medication    PRECAUTIONS: Fall  RED FLAGS: None   WEIGHT BEARING RESTRICTIONS: No  FALLS:  Has patient fallen in last 6 months? Yes. Number of falls 1  LIVING ENVIRONMENT: Lives with: lives with their daughter Lives in:  House/apartment Stairs: Yes: Internal: 15 steps; on left going up; step to pattern Has following equipment at home: shower chair  OCCUPATION: retired  PLOF: Independent  PATIENT GOALS: reduced pain, improved mobility, and be able to stand upright  NEXT MD VISIT: none scheduled, but patient is unsure  OBJECTIVE:  Note: Objective measures were completed at Evaluation unless otherwise noted.  PATIENT SURVEYS:  Modified Oswestry:  MODIFIED OSWESTRY DISABILITY SCALE  Date: 08/06/24 Score  Pain intensity 3 =  Pain medication provides me with moderate relief from pain.  2. Personal care (washing, dressing, etc.) 1 =  I can take care of myself normally, but it increases my pain.  3. Lifting 5 =  I cannot lift or carry anything at all.  4. Walking 3 =  Pain prevents me from walking more than  mile.  5. Sitting 2 =  Pain prevents me from sitting more than 1 hour.  6. Standing 2 =  Pain prevents me from standing more than 1 hour  7. Sleeping 2 =  Even when I take pain medication, I sleep less than 6 hours  8. Social Life 4 =  Pain has restricted my social life to my home  9. Traveling 2 =  My pain restricts my travel over 2 hours.  10. Employment/ Homemaking 2 = I can perform most of my homemaking/job duties, but pain prevents me from performing more physically stressful activities (eg, lifting, vacuuming).  Total 26/50   Interpretation of scores: Score Category Description  0-20% Minimal Disability The patient can cope with most living activities. Usually no treatment is indicated apart from advice on lifting, sitting and exercise  21-40% Moderate Disability The patient experiences more pain and difficulty with sitting, lifting and standing. Travel and social life are more difficult and they may be disabled from work. Personal care, sexual activity and sleeping are not grossly affected, and the patient can usually be managed by conservative means  41-60% Severe Disability Pain  remains the  main problem in this group, but activities of daily living are affected. These patients require a detailed investigation  61-80% Crippled Back pain impinges on all aspects of the patient's life. Positive intervention is required  81-100% Bed-bound These patients are either bed-bound or exaggerating their symptoms  Bluford FORBES Zoe DELENA Karon DELENA, et al. Surgery versus conservative management of stable thoracolumbar fracture: the PRESTO feasibility RCT. [place] ([place]): [employer]; 2021 Nov. [facility] Technology Assessment, No. 25.62.) Appendix 3, Oswestry Disability Index category descriptors. Available from: Findjewelers.cz  Minimally Clinically Important Difference (MCID) = 12.8%  COGNITION: Overall cognitive status: Within functional limits for tasks assessed     SENSATION: Patient reports intermittent tingling in her back, but none currently.  POSTURE: rounded shoulders, forward head, decreased lumbar lordosis, increased thoracic kyphosis, and flexed trunk   LUMBAR ROM:   AROM eval  Flexion 50% limited  Extension Unable to achieve neutral due to pain   Right lateral flexion   Left lateral flexion   Right rotation 75% limited; familiar pain   Left rotation 75% limited; familiar pain    (Blank rows = not tested)  LOWER EXTREMITY ROM:  WFL for activities assessed  LOWER EXTREMITY MMT:    MMT Right eval Left eval  Hip flexion 3+/5; pulling 3+/5  Hip extension    Hip abduction    Hip adduction    Hip internal rotation    Hip external rotation    Knee flexion 4+/5; familiar pain  4+/5; familiar pain (not as bad as right side)  Knee extension 4+/5 5/5  Ankle dorsiflexion 4-/5 4-/5  Ankle plantarflexion    Ankle inversion    Ankle eversion     (Blank rows = not tested)  FUNCTIONAL TESTS:  5 times sit to stand: 25.43 seconds; intermittent use of armrests Timed up and go (TUG): 15.38 seconds 2 minute walk test: 285 feet; reported  bilateral leg pain, but no back pain  GAIT: Distance walked: 285 feet Assistive device utilized: None Level of assistance: Complete Independence Comments: flexed trunk with poor stride length and gait speed  TREATMENT DATE:                                                                                                                               08/12/2024  Therapeutic Exercise: -Nustep, 5 minutes, level 3 resistance, pt cued for 70 spm -Supine bridges, 2 sets of 4 reps, 3 second holds, pt cued for max hip extension -Double knees to chest, 2 sets of 10 reps, on green theraball, pt cued for max pain free ROM and smooth motion Neuromuscular Re-education: -Lower trunk rotations on green exercise ball, 2 set of 10 reps, bilaterally, pt cued to remain in pain free ROM -Side plank, 1 set of 2 reps of 15 second holds, bilaterally, pt cued for increased hip elevation and proper LE/UE placement, increased verbal cuing required for set up -Modified crunch, 2 set of  8 reps, 3 second holds, pt cued for sequencing and UE/LE placement -Bird dog, 2 set of 7 reps, bilaterally, pt cued for increased hip ROM and neutral spine throughout movement Therapeutic Activity: -Forward lunges on bosu ball in parallel bars, 1 set of 5 reps, bilaterally, pt cued for core activation and upright posture -Sit to stands with kettle bell 10lb, 2 sets of 5 reps, pt cued for core activation and keeping weight close to chest   08/06/24: PT evaluation, patient education, and HEP   PATIENT EDUCATION:  Education details: Plan of care, prognosis, objective findings, HEP, and goals for physical therapy Person educated: Patient Education method: Explanation, Demonstration, and Handouts Education comprehension: verbalized understanding and returned demonstration  HOME EXERCISE PROGRAM: Access Code: [email or web address] URL: [email or web address] Date: 08/06/2024 Prepared by: Lacinda Fass  Exercises - Supine Hip  Adduction Isometric with Ball  - 1 x daily - 7 x weekly - 3 sets - 10 reps - 5 seconds hold - Seated Hip Adduction Isometrics with Ball  - 1 x daily - 7 x weekly - 3 sets - 10 reps - 5 seconds hold - Supine Bilateral Hamstring Sets  - 1 x daily - 7 x weekly - 3 sets - 10 reps - 5 seconds hold - Seated Hamstring Set  - 1 x daily - 7 x weekly - 3 sets - 10 reps - 5 seconds hold  ASSESSMENT:  CLINICAL IMPRESSION: Patient continues to demonstrate increased low back pain, decreased core/LE strength, decreased gait quality, balance and coordination. Patient also demonstrates decreased endurance with aerobic based exercise during today's session, increased trunk flexion. Patient able to initiate dynamic balance and core activation exercises today with lunge variation and core work, decrease in symptoms and good performance with verbal cueing note. Patient would continue to benefit from skilled physical therapy for decreased low back pain, increased endurance with ambulation, increased LE/core strength, and improved balance for improved quality of life, improved independence with gait training and continued progress towards therapy goals.  Eval: Patient is a 73 y.o. female who was seen today for physical therapy evaluation and treatment for chronic low back pain.  She presented with low to moderate pain severity and irritability with bilateral hamstring strength testing and lumbar extension and rotation reproducing her familiar symptoms.  She exhibited significant deficits in lumbar active range of motion which result in her abnormal posture and gait deviations.  She is at an elevated fall risk as evidenced by her timed up and go and 5 times sit to stand times in addition to her history of falling.  She was provided a HEP and she was able to properly demonstrate these interventions.  She reported feeling comfortable completing these interventions at home.  Recommend that she continue with skilled physical therapy  to address her impairments to maximize her safety and functional mobility.  OBJECTIVE IMPAIRMENTS: Abnormal gait, decreased activity tolerance, decreased balance, decreased endurance, decreased mobility, difficulty walking, decreased ROM, decreased strength, hypomobility, postural dysfunction, and pain.   ACTIVITY LIMITATIONS: carrying, lifting, bending, standing, transfers, and locomotion level  PARTICIPATION LIMITATIONS: cleaning, laundry, shopping, and community activity  PERSONAL FACTORS: Past/current experiences, Time since onset of injury/illness/exacerbation, and 3+ comorbidities: Anxiety, OA, COPD, history of COVID-19, and history of depression   are also affecting patient's functional outcome.   REHAB POTENTIAL: Fair    CLINICAL DECISION MAKING: Evolving/moderate complexity  EVALUATION COMPLEXITY: Moderate   GOALS: Goals reviewed with patient? Yes  SHORT TERM GOALS: Target date: 08/27/24  Patient will be  independent with her initial HEP. Baseline: Goal status: INITIAL  2.  Patient will improve her modified ODI to 15/50 or better for improved perceived function with her daily activities. Baseline:  Goal status: INITIAL  3.  Patient will be able to demonstrate improved lumbar extension to neutral for improved awareness of her surroundings. Baseline:  Goal status: INITIAL  4.  Patient will be able to complete her daily activities without her familiar pain exceeding 8/10. Baseline:  Goal status: INITIAL  LONG TERM GOALS: Target date: 09/17/24  Patient will be independent with her advanced HEP. Baseline:  Goal status: INITIAL  2.  Patient will improve her timed up and go time to 12 seconds or less to reduce her risk of falling. Baseline:  Goal status: INITIAL  3.  Patient will improve her 2-minute walk test distance by at least 50 feet for improved community mobility. Baseline:  Goal status: INITIAL  4.  Patient will improve her 5 times sit to stand time to 12  seconds or less for improved lower extremity power. Baseline:  Goal status: INITIAL  5.  Patient will be able to complete her daily activities without her familiar pain exceeding 6/10. Baseline:  Goal status: INITIAL  PLAN:  PT FREQUENCY: 2x/week  PT DURATION: 6 weeks  PLANNED INTERVENTIONS: 97164- PT Re-evaluation, 97750- Physical Performance Testing, 97110-Therapeutic exercises, 97530- Therapeutic activity, 97112- Neuromuscular re-education, 97535- Self Care, [id number]- Manual therapy, [phone]- Gait training, [phone]- Electrical stimulation (unattended), Patient/Family education, Balance training, Stair training, Taping, Joint mobilization, Spinal mobilization, Cryotherapy, and Moist heat.  PLAN FOR NEXT SESSION: Postural reeducation, lumbar and lower extremity strengthening, and balance interventions   Lang Ada, PT, DPT [facility] Office: [phone] 12:02 PM, 08/12/24

## 2024-08-13 ENCOUNTER — Encounter (HOSPITAL_COMMUNITY): Payer: Self-pay

## 2024-08-13 ENCOUNTER — Ambulatory Visit (HOSPITAL_COMMUNITY)

## 2024-08-13 DIAGNOSIS — R2689 Other abnormalities of gait and mobility: Secondary | ICD-10-CM

## 2024-08-13 DIAGNOSIS — M5459 Other low back pain: Secondary | ICD-10-CM

## 2024-08-13 DIAGNOSIS — R293 Abnormal posture: Secondary | ICD-10-CM

## 2024-08-13 NOTE — Therapy (Signed)
 OUTPATIENT PHYSICAL THERAPY THORACOLUMBAR TREATMENT   Patient Name: Rebecca Hansen MRN: [id number] DOB:1951-02-08, 73 y.o., female Today's Date: 08/13/2024  END OF SESSION:  PT End of Session - 08/13/24 1332     Visit Number 3    Number of Visits 12    Date for Recertification  10/29/24    Authorization Type [employer]    Authorization Time Period 12 approved, 08/06/24-09/17/24    Authorization - Visit Number 2    Authorization - Number of Visits 12    Progress Note Due on Visit 10    PT Start Time 1332    PT Stop Time 1410    PT Time Calculation (min) 38 min    Activity Tolerance Patient tolerated treatment well    Behavior During Therapy WFL for tasks assessed/performed           Past Medical History:  Diagnosis Date   Anxiety    Arthritis    COPD (chronic obstructive pulmonary disease) (HCC)    Depression    Past Surgical History:  Procedure Laterality Date   CATARACT EXTRACTION W/PHACO Left 03/02/2021   Procedure: CATARACT EXTRACTION PHACO AND INTRAOCULAR LENS PLACEMENT (IOC);  Surgeon: Harrie Agent, MD;  Location: [facility];  Service: Ophthalmology;  Laterality: Left;  CDE: 1136   CATARACT EXTRACTION W/PHACO Right 03/30/2021   Procedure: CATARACT EXTRACTION PHACO AND INTRAOCULAR LENS PLACEMENT (IOC);  Surgeon: Harrie Agent, MD;  Location: [facility];  Service: Ophthalmology;  Laterality: Right;  CDE: 13.98   HYSTEROSCOPY     Patient Active Problem List   Diagnosis Date Noted   Intractable vomiting 10/04/2019   Lobar pneumonia 10/03/2019   Thrombocytopenia 10/03/2019   Chronic pain syndrome 10/03/2019   Acute respiratory disease due to COVID-19 virus 10/02/2019   Pneumonia due to COVID-19 virus 10/02/2019   COPD (chronic obstructive pulmonary disease) (HCC) 10/02/2019   Acute on chronic respiratory failure with hypoxia due to Covid pneumonia 10/02/2019   Hyperglycemia, drug-induced 06/20/2015   Abnormal thyroid function test 06/20/2015   Tobacco abuse  06/19/2015   Back pain 06/19/2015   Anxiety 06/19/2015    PCP: Nemiah Kemps, MD  REFERRING PROVIDER: Hyacinth Honey, NP   REFERRING DIAG: back pain   Rationale for Evaluation and Treatment: Rehabilitation  THERAPY DIAG:  Other low back pain  Abnormal posture  Other abnormalities of gait and mobility  ONSET DATE: March-April 2025  SUBJECTIVE:                                                                                                                                                                                           SUBJECTIVE  STATEMENT: Pt states she was sore in her legs and back after last session. Pt states she has been doing the HEP. Pt states back pain is a 9/10 this date. Pt states she has still been staying in due to slumping forward.  Eval: Patient reports that her back has been bothering her for about a year, but she initially injured her back years ago when she was in her 62's. She got a new mattress in late March or early April and she has been having pain since then. She has changed how she sleeps as she no longer sleeps with a pillow between her knees which she used for years. She likes to walk, but she cannot get anyone to walk with her and she does not want to walk alone. However, she laid in bed for about a month after her fiance died earlier this year. She fell off a ladder about 1.5 months ago. She ended up hitting her back on the chair behind her and her arm on the dresser.   PERTINENT HISTORY:  Anxiety, OA, COPD, history of COVID-19, and history of depression    PAIN:  Are you having pain? Yes: NPRS scale: Current: 3/10 Best: 3/10 Worst: 10+/10 Pain location: low back  Pain description: spasms, sharp, stabbing, constant Aggravating factors: bending over,   Relieving factors: medication    PRECAUTIONS: Fall  RED FLAGS: None   WEIGHT BEARING RESTRICTIONS: No  FALLS:  Has patient fallen in last 6 months? Yes. Number of falls 1  LIVING  ENVIRONMENT: Lives with: lives with their daughter Lives in: House/apartment Stairs: Yes: Internal: 15 steps; on left going up; step to pattern Has following equipment at home: shower chair  OCCUPATION: retired  PLOF: Independent  PATIENT GOALS: reduced pain, improved mobility, and be able to stand upright  NEXT MD VISIT: none scheduled, but patient is unsure  OBJECTIVE:  Note: Objective measures were completed at Evaluation unless otherwise noted.  PATIENT SURVEYS:  Modified Oswestry:  MODIFIED OSWESTRY DISABILITY SCALE  Date: 08/06/24 Score  Pain intensity 3 =  Pain medication provides me with moderate relief from pain.  2. Personal care (washing, dressing, etc.) 1 =  I can take care of myself normally, but it increases my pain.  3. Lifting 5 =  I cannot lift or carry anything at all.  4. Walking 3 =  Pain prevents me from walking more than  mile.  5. Sitting 2 =  Pain prevents me from sitting more than 1 hour.  6. Standing 2 =  Pain prevents me from standing more than 1 hour  7. Sleeping 2 =  Even when I take pain medication, I sleep less than 6 hours  8. Social Life 4 =  Pain has restricted my social life to my home  9. Traveling 2 =  My pain restricts my travel over 2 hours.  10. Employment/ Homemaking 2 = I can perform most of my homemaking/job duties, but pain prevents me from performing more physically stressful activities (eg, lifting, vacuuming).  Total 26/50   Interpretation of scores: Score Category Description  0-20% Minimal Disability The patient can cope with most living activities. Usually no treatment is indicated apart from advice on lifting, sitting and exercise  21-40% Moderate Disability The patient experiences more pain and difficulty with sitting, lifting and standing. Travel and social life are more difficult and they may be disabled from work. Personal care, sexual activity and sleeping are not grossly affected, and the patient can usually  be managed by  conservative means  41-60% Severe Disability Pain remains the main problem in this group, but activities of daily living are affected. These patients require a detailed investigation  61-80% Crippled Back pain impinges on all aspects of the patient's life. Positive intervention is required  81-100% Bed-bound These patients are either bed-bound or exaggerating their symptoms  Bluford FORBES Zoe DELENA Karon DELENA, et al. Surgery versus conservative management of stable thoracolumbar fracture: the PRESTO feasibility RCT. [place] ([place]): [employer]; 2021 Nov. [facility] Technology Assessment, No. 25.62.) Appendix 3, Oswestry Disability Index category descriptors. Available from: Findjewelers.cz  Minimally Clinically Important Difference (MCID) = 12.8%  COGNITION: Overall cognitive status: Within functional limits for tasks assessed     SENSATION: Patient reports intermittent tingling in her back, but none currently.  POSTURE: rounded shoulders, forward head, decreased lumbar lordosis, increased thoracic kyphosis, and flexed trunk   LUMBAR ROM:   AROM eval  Flexion 50% limited  Extension Unable to achieve neutral due to pain   Right lateral flexion   Left lateral flexion   Right rotation 75% limited; familiar pain   Left rotation 75% limited; familiar pain    (Blank rows = not tested)  LOWER EXTREMITY ROM:  WFL for activities assessed  LOWER EXTREMITY MMT:    MMT Right eval Left eval  Hip flexion 3+/5; pulling 3+/5  Hip extension    Hip abduction    Hip adduction    Hip internal rotation    Hip external rotation    Knee flexion 4+/5; familiar pain  4+/5; familiar pain (not as bad as right side)  Knee extension 4+/5 5/5  Ankle dorsiflexion 4-/5 4-/5  Ankle plantarflexion    Ankle inversion    Ankle eversion     (Blank rows = not tested)  FUNCTIONAL TESTS:  5 times sit to stand: 25.43 seconds; intermittent use of armrests Timed up and  go (TUG): 15.38 seconds 2 minute walk test: 285 feet; reported bilateral leg pain, but no back pain  GAIT: Distance walked: 285 feet Assistive device utilized: None Level of assistance: Complete Independence Comments: flexed trunk with poor stride length and gait speed  TREATMENT DATE:                                                                                                                               08/13/2024  Manual Therapy: -CPA/UPA mobilizations of Lumbar segments L2-L5, grade II-III  -STM of Lumbar Paraspinal musculature, pt requests no lotion Therapeutic Exercise: -Nustep, 5 minutes, level 3 resistance, pt cued for 60 spm -Supine bridges, 2 sets of 10 reps, 3 second holds, pt cued for max hip extension -Single knees to chest, 2 sets 30 second holds bilaterally, on green theraball, pt cued for max pain free ROM and smooth motion Neuromuscular Re-education: -Lower trunk rotations, 2 set of 10 reps, bilaterally, pt cued to remain in pain free ROM -Standing balance: narrow BOS EC, semi tandem  EC, SLS, EO, 30 second bouts pt cued for decreased  -STS with semi tandem stance, 1 set of 5 reps bilaterally, pt cued for LE placement and core activation upright neutral spine   08/12/2024  Therapeutic Exercise: -Nustep, 5 minutes, level 3 resistance, pt cued for 70 spm -Supine bridges, 2 sets of 4 reps, 3 second holds, pt cued for max hip extension -Double knees to chest, 2 sets of 10 reps, on green theraball, pt cued for max pain free ROM and smooth motion Neuromuscular Re-education: -Lower trunk rotations on green exercise ball, 2 set of 10 reps, bilaterally, pt cued to remain in pain free ROM -Side plank, 1 set of 2 reps of 15 second holds, bilaterally, pt cued for increased hip elevation and proper LE/UE placement, increased verbal cuing required for set up -Modified crunch, 2 set of 8 reps, 3 second holds, pt cued for sequencing and UE/LE placement -Bird dog, 2 set of 7 reps,  bilaterally, pt cued for increased hip ROM and neutral spine throughout movement Therapeutic Activity: -Forward lunges on bosu ball in parallel bars, 1 set of 5 reps, bilaterally, pt cued for core activation and upright posture -Sit to stands with kettle bell 10lb, 2 sets of 5 reps, pt cued for core activation and keeping weight close to chest   08/06/24: PT evaluation, patient education, and HEP   PATIENT EDUCATION:  Education details: Plan of care, prognosis, objective findings, HEP, and goals for physical therapy Person educated: Patient Education method: Explanation, Demonstration, and Handouts Education comprehension: verbalized understanding and returned demonstration  HOME EXERCISE PROGRAM: Access Code: [email or web address] URL: [email or web address] Date: 08/06/2024 Prepared by: Lacinda Fass  Exercises - Supine Hip Adduction Isometric with Ball  - 1 x daily - 7 x weekly - 3 sets - 10 reps - 5 seconds hold - Seated Hip Adduction Isometrics with Ball  - 1 x daily - 7 x weekly - 3 sets - 10 reps - 5 seconds hold - Supine Bilateral Hamstring Sets  - 1 x daily - 7 x weekly - 3 sets - 10 reps - 5 seconds hold - Seated Hamstring Set  - 1 x daily - 7 x weekly - 3 sets - 10 reps - 5 seconds hold  ASSESSMENT:  CLINICAL IMPRESSION: Patient continues to demonstrate increased low back pain, decreased core/LE strength, decreased gait quality, balance and coordination. Patient also demonstrates decreased endurance with aerobic based exercise during today's session, increased trunk flexion. Patient able to progress dynamic balance and core activation exercises today with single leg to chest and standing balance activities, good performance with verbal cueing note. Pt responds well to manual therapy today, stiff lumbar spine and few pops noted with grade II and III. Patient would continue to benefit from skilled physical therapy for decreased low back pain, increased endurance with ambulation,  increased LE/core strength, and improved balance for improved quality of life, improved independence with gait training and continued progress towards therapy goals.  Eval: Patient is a 73 y.o. female who was seen today for physical therapy evaluation and treatment for chronic low back pain.  She presented with low to moderate pain severity and irritability with bilateral hamstring strength testing and lumbar extension and rotation reproducing her familiar symptoms.  She exhibited significant deficits in lumbar active range of motion which result in her abnormal posture and gait deviations.  She is at an elevated fall risk as evidenced by her timed up and go and 5 times sit to stand times in addition  to her history of falling.  She was provided a HEP and she was able to properly demonstrate these interventions.  She reported feeling comfortable completing these interventions at home.  Recommend that she continue with skilled physical therapy to address her impairments to maximize her safety and functional mobility.  OBJECTIVE IMPAIRMENTS: Abnormal gait, decreased activity tolerance, decreased balance, decreased endurance, decreased mobility, difficulty walking, decreased ROM, decreased strength, hypomobility, postural dysfunction, and pain.   ACTIVITY LIMITATIONS: carrying, lifting, bending, standing, transfers, and locomotion level  PARTICIPATION LIMITATIONS: cleaning, laundry, shopping, and community activity  PERSONAL FACTORS: Past/current experiences, Time since onset of injury/illness/exacerbation, and 3+ comorbidities: Anxiety, OA, COPD, history of COVID-19, and history of depression   are also affecting patient's functional outcome.   REHAB POTENTIAL: Fair    CLINICAL DECISION MAKING: Evolving/moderate complexity  EVALUATION COMPLEXITY: Moderate   GOALS: Goals reviewed with patient? Yes  SHORT TERM GOALS: Target date: 08/27/24  Patient will be independent with her initial  HEP. Baseline: Goal status: INITIAL  2.  Patient will improve her modified ODI to 15/50 or better for improved perceived function with her daily activities. Baseline:  Goal status: INITIAL  3.  Patient will be able to demonstrate improved lumbar extension to neutral for improved awareness of her surroundings. Baseline:  Goal status: INITIAL  4.  Patient will be able to complete her daily activities without her familiar pain exceeding 8/10. Baseline:  Goal status: INITIAL  LONG TERM GOALS: Target date: 09/17/24  Patient will be independent with her advanced HEP. Baseline:  Goal status: INITIAL  2.  Patient will improve her timed up and go time to 12 seconds or less to reduce her risk of falling. Baseline:  Goal status: INITIAL  3.  Patient will improve her 2-minute walk test distance by at least 50 feet for improved community mobility. Baseline:  Goal status: INITIAL  4.  Patient will improve her 5 times sit to stand time to 12 seconds or less for improved lower extremity power. Baseline:  Goal status: INITIAL  5.  Patient will be able to complete her daily activities without her familiar pain exceeding 6/10. Baseline:  Goal status: INITIAL  PLAN:  PT FREQUENCY: 2x/week  PT DURATION: 6 weeks  PLANNED INTERVENTIONS: 97164- PT Re-evaluation, 97750- Physical Performance Testing, 97110-Therapeutic exercises, 97530- Therapeutic activity, 97112- Neuromuscular re-education, 97535- Self Care, 02859- Manual therapy, [phone]- Gait training, [phone]- Electrical stimulation (unattended), Patient/Family education, Balance training, Stair training, Taping, Joint mobilization, Spinal mobilization, Cryotherapy, and Moist heat.  PLAN FOR NEXT SESSION: Postural reeducation, lumbar and lower extremity strengthening, and balance interventions   Lang Ada, PT, DPT [facility] Office: [phone] 2:14 PM, 08/13/24

## 2024-08-17 ENCOUNTER — Ambulatory Visit (HOSPITAL_COMMUNITY)

## 2024-08-19 ENCOUNTER — Telehealth (HOSPITAL_COMMUNITY): Payer: Self-pay

## 2024-08-19 ENCOUNTER — Ambulatory Visit (HOSPITAL_COMMUNITY)

## 2024-08-19 NOTE — Telephone Encounter (Signed)
 Pt was called concerning her missed appointment this morning. Pt states she got her times mixed up but states she should be here at her next appointment time next Tuesday. Pt reeducated on no show policy, this is no show #1.  Lang Ada, PT, DPT [facility] Office: [phone] 11:55 AM, 08/19/24

## 2024-08-24 ENCOUNTER — Ambulatory Visit (HOSPITAL_COMMUNITY)

## 2024-08-24 ENCOUNTER — Encounter (HOSPITAL_COMMUNITY): Payer: Self-pay

## 2024-08-24 DIAGNOSIS — M5459 Other low back pain: Secondary | ICD-10-CM

## 2024-08-24 DIAGNOSIS — R2689 Other abnormalities of gait and mobility: Secondary | ICD-10-CM

## 2024-08-24 DIAGNOSIS — R293 Abnormal posture: Secondary | ICD-10-CM

## 2024-08-24 NOTE — Therapy (Signed)
 OUTPATIENT PHYSICAL THERAPY THORACOLUMBAR TREATMENT   Patient Name: Rebecca Hansen MRN: [id number] DOB:1951/07/06, 73 y.o., female Today's Date: 08/24/2024  END OF SESSION:  PT End of Session - 08/24/24 1332     Visit Number 4    Number of Visits 12    Date for Recertification  10/29/24    Authorization Type [employer]    Authorization Time Period 12 approved, 08/06/24-09/17/24    Authorization - Visit Number 3    Authorization - Number of Visits 12    Progress Note Due on Visit 10    PT Start Time 1333    PT Stop Time 1411    PT Time Calculation (min) 38 min    Activity Tolerance Patient tolerated treatment well    Behavior During Therapy WFL for tasks assessed/performed            Past Medical History:  Diagnosis Date   Anxiety    Arthritis    COPD (chronic obstructive pulmonary disease) (HCC)    Depression    Past Surgical History:  Procedure Laterality Date   CATARACT EXTRACTION W/PHACO Left 03/02/2021   Procedure: CATARACT EXTRACTION PHACO AND INTRAOCULAR LENS PLACEMENT (IOC);  Surgeon: Harrie Agent, MD;  Location: [facility];  Service: Ophthalmology;  Laterality: Left;  CDE: 1136   CATARACT EXTRACTION W/PHACO Right 03/30/2021   Procedure: CATARACT EXTRACTION PHACO AND INTRAOCULAR LENS PLACEMENT (IOC);  Surgeon: Harrie Agent, MD;  Location: [facility];  Service: Ophthalmology;  Laterality: Right;  CDE: 13.98   HYSTEROSCOPY     Patient Active Problem List   Diagnosis Date Noted   Intractable vomiting 10/04/2019   Lobar pneumonia 10/03/2019   Thrombocytopenia 10/03/2019   Chronic pain syndrome 10/03/2019   Acute respiratory disease due to COVID-19 virus 10/02/2019   Pneumonia due to COVID-19 virus 10/02/2019   COPD (chronic obstructive pulmonary disease) (HCC) 10/02/2019   Acute on chronic respiratory failure with hypoxia due to Covid pneumonia 10/02/2019   Hyperglycemia, drug-induced 06/20/2015   Abnormal thyroid function test 06/20/2015   Tobacco abuse  06/19/2015   Back pain 06/19/2015   Anxiety 06/19/2015    PCP: Nemiah Kemps, MD  REFERRING PROVIDER: Hyacinth Honey, NP   REFERRING DIAG: back pain   Rationale for Evaluation and Treatment: Rehabilitation  THERAPY DIAG:  Other low back pain  Abnormal posture  Other abnormalities of gait and mobility  ONSET DATE: March-April 2025  SUBJECTIVE:  SUBJECTIVE STATEMENT: Pt states she is feeling more soreness than pain this date. Pt states she has had a few busy days with grandkids. Pt reports symptoms are 9/10 this date.   Eval: Patient reports that her back has been bothering her for about a year, but she initially injured her back years ago when she was in her 67's. She got a new mattress in late March or early April and she has been having pain since then. She has changed how she sleeps as she no longer sleeps with a pillow between her knees which she used for years. She likes to walk, but she cannot get anyone to walk with her and she does not want to walk alone. However, she laid in bed for about a month after her fiance died earlier this year. She fell off a ladder about 1.5 months ago. She ended up hitting her back on the chair behind her and her arm on the dresser.   PERTINENT HISTORY:  Anxiety, OA, COPD, history of COVID-19, and history of depression    PAIN:  Are you having pain? Yes: NPRS scale: Current: 3/10 Best: 3/10 Worst: 10+/10 Pain location: low back  Pain description: spasms, sharp, stabbing, constant Aggravating factors: bending over,   Relieving factors: medication    PRECAUTIONS: Fall  RED FLAGS: None   WEIGHT BEARING RESTRICTIONS: No  FALLS:  Has patient fallen in last 6 months? Yes. Number of falls 1  LIVING ENVIRONMENT: Lives with: lives with their daughter Lives  in: House/apartment Stairs: Yes: Internal: 15 steps; on left going up; step to pattern Has following equipment at home: shower chair  OCCUPATION: retired  PLOF: Independent  PATIENT GOALS: reduced pain, improved mobility, and be able to stand upright  NEXT MD VISIT: none scheduled, but patient is unsure  OBJECTIVE:  Note: Objective measures were completed at Evaluation unless otherwise noted.  PATIENT SURVEYS:  Modified Oswestry:  MODIFIED OSWESTRY DISABILITY SCALE  Date: 08/06/24 Score  Pain intensity 3 =  Pain medication provides me with moderate relief from pain.  2. Personal care (washing, dressing, etc.) 1 =  I can take care of myself normally, but it increases my pain.  3. Lifting 5 =  I cannot lift or carry anything at all.  4. Walking 3 =  Pain prevents me from walking more than  mile.  5. Sitting 2 =  Pain prevents me from sitting more than 1 hour.  6. Standing 2 =  Pain prevents me from standing more than 1 hour  7. Sleeping 2 =  Even when I take pain medication, I sleep less than 6 hours  8. Social Life 4 =  Pain has restricted my social life to my home  9. Traveling 2 =  My pain restricts my travel over 2 hours.  10. Employment/ Homemaking 2 = I can perform most of my homemaking/job duties, but pain prevents me from performing more physically stressful activities (eg, lifting, vacuuming).  Total 26/50   Interpretation of scores: Score Category Description  0-20% Minimal Disability The patient can cope with most living activities. Usually no treatment is indicated apart from advice on lifting, sitting and exercise  21-40% Moderate Disability The patient experiences more pain and difficulty with sitting, lifting and standing. Travel and social life are more difficult and they may be disabled from work. Personal care, sexual activity and sleeping are not grossly affected, and the patient can usually be managed by conservative means  41-60% Severe Disability Pain remains  the main problem in this group, but activities of daily living are affected. These patients require a detailed investigation  61-80% Crippled Back pain impinges on all aspects of the patient's life. Positive intervention is required  81-100% Bed-bound These patients are either bed-bound or exaggerating their symptoms  Bluford FORBES Zoe DELENA Karon DELENA, et al. Surgery versus conservative management of stable thoracolumbar fracture: the PRESTO feasibility RCT. [place] ([place]): [employer]; 2021 Nov. [facility] Technology Assessment, No. 25.62.) Appendix 3, Oswestry Disability Index category descriptors. Available from: Findjewelers.cz  Minimally Clinically Important Difference (MCID) = 12.8%  COGNITION: Overall cognitive status: Within functional limits for tasks assessed     SENSATION: Patient reports intermittent tingling in her back, but none currently.  POSTURE: rounded shoulders, forward head, decreased lumbar lordosis, increased thoracic kyphosis, and flexed trunk   LUMBAR ROM:   AROM eval  Flexion 50% limited  Extension Unable to achieve neutral due to pain   Right lateral flexion   Left lateral flexion   Right rotation 75% limited; familiar pain   Left rotation 75% limited; familiar pain    (Blank rows = not tested)  LOWER EXTREMITY ROM:  WFL for activities assessed  LOWER EXTREMITY MMT:    MMT Right eval Left eval  Hip flexion 3+/5; pulling 3+/5  Hip extension    Hip abduction    Hip adduction    Hip internal rotation    Hip external rotation    Knee flexion 4+/5; familiar pain  4+/5; familiar pain (not as bad as right side)  Knee extension 4+/5 5/5  Ankle dorsiflexion 4-/5 4-/5  Ankle plantarflexion    Ankle inversion    Ankle eversion     (Blank rows = not tested)  FUNCTIONAL TESTS:  5 times sit to stand: 25.43 seconds; intermittent use of armrests Timed up and go (TUG): 15.38 seconds 2 minute walk test: 285 feet;  reported bilateral leg pain, but no back pain  GAIT: Distance walked: 285 feet Assistive device utilized: None Level of assistance: Complete Independence Comments: flexed trunk with poor stride length and gait speed  TREATMENT DATE:                                                                                                                               08/24/2024  Therapeutic Exercise: -Supine bridges, 2 sets of 10 reps, 3 second holds, pt cued for max hip extension, on green exercise ball -Double knees to chest, 2 sets of 10 reps, on green theraball, pt cued for max pain free ROM and smooth motion -Thoracic and lumbar extensions in arm chair, 1 set of 5 reps, 10 second holds, pt cued for pain free ROM Neuromuscular Re-education: -Lower trunk rotations, 1 set of 10 reps, bilaterally, pt cued to remain in pain free ROM -Shoulder extensions on black foam, 2 set of 10 reps, GTB at chest level on web slide, pt cued for sequencing  -Side plank, 1 set  of 2 reps of 30 second holds, bilaterally, pt cued for increased hip elevation and proper LE/UE placement -Bird dog, 2 set of 7 reps, bilaterally, pt cued for increased hip ROM and neutral spine throughout movement Therapeutic Activity: -Lateral stepping, 2 laps, 20 feet per lap, with BTB around ankles, pt cued for upright posture and slight bend in knees -Aura carries with 5lb kettle bell, 1 minute each arm, pt cued for core activation and keeping weight close to chest   08/13/2024  Manual Therapy: -CPA/UPA mobilizations of Lumbar segments L2-L5, grade II-III  -STM of Lumbar Paraspinal musculature, pt requests no lotion Therapeutic Exercise: -Nustep, 5 minutes, level 3 resistance, pt cued for 60 spm -Supine bridges, 2 sets of 10 reps, 3 second holds, pt cued for max hip extension -Single knees to chest, 2 sets 30 second holds bilaterally, on green theraball, pt cued for max pain free ROM and smooth motion Neuromuscular  Re-education: -Lower trunk rotations, 2 set of 10 reps, bilaterally, pt cued to remain in pain free ROM -Standing balance: narrow BOS EC, semi tandem EC, SLS, EO, 30 second bouts pt cued for decreased  -STS with semi tandem stance, 1 set of 5 reps bilaterally, pt cued for LE placement and core activation upright neutral spine   08/12/2024  Therapeutic Exercise: -Nustep, 5 minutes, level 3 resistance, pt cued for 70 spm -Supine bridges, 2 sets of 4 reps, 3 second holds, pt cued for max hip extension -Double knees to chest, 2 sets of 10 reps, on green theraball, pt cued for max pain free ROM and smooth motion Neuromuscular Re-education: -Lower trunk rotations on green exercise ball, 2 set of 10 reps, bilaterally, pt cued to remain in pain free ROM -Side plank, 1 set of 2 reps of 15 second holds, bilaterally, pt cued for increased hip elevation and proper LE/UE placement, increased verbal cuing required for set up -Modified crunch, 2 set of 8 reps, 3 second holds, pt cued for sequencing and UE/LE placement -Bird dog, 2 set of 7 reps, bilaterally, pt cued for increased hip ROM and neutral spine throughout movement Therapeutic Activity: -Forward lunges on bosu ball in parallel bars, 1 set of 5 reps, bilaterally, pt cued for core activation and upright posture -Sit to stands with kettle bell 10lb, 2 sets of 5 reps, pt cued for core activation and keeping weight close to chest   PATIENT EDUCATION:  Education details: Plan of care, prognosis, objective findings, HEP, and goals for physical therapy Person educated: Patient Education method: Explanation, Demonstration, and Handouts Education comprehension: verbalized understanding and returned demonstration  HOME EXERCISE PROGRAM: Access Code: [email or web address] URL: [email or web address] Date: 08/06/2024 Prepared by: Lacinda Fass  Exercises - Supine Hip Adduction Isometric with Ball  - 1 x daily - 7 x weekly - 3 sets - 10 reps - 5  seconds hold - Seated Hip Adduction Isometrics with Ball  - 1 x daily - 7 x weekly - 3 sets - 10 reps - 5 seconds hold - Supine Bilateral Hamstring Sets  - 1 x daily - 7 x weekly - 3 sets - 10 reps - 5 seconds hold - Seated Hamstring Set  - 1 x daily - 7 x weekly - 3 sets - 10 reps - 5 seconds hold  ASSESSMENT:  CLINICAL IMPRESSION: Patient continues to demonstrate increased low back pain/soreness, decreased LE/core strength, decreased gait quality and balance. Patient also demonstrates increased flexion with prolonged walking and standing, leaning towards possible stenosis based nerve involvement. Pt demonstrates  decreased endurance with aerobic based exercise during today's session, O2 dropped to 85 following farmer carries, SOB evident. Patient able to continue dynamic balance and core activation exercises today with kettle bell farmer carries and bridge variation, good performance with verbal cueing. Patient would continue to benefit from skilled physical therapy for decreased low back pain, increased endurance with ambulation, increased LE/core strength, and improved balance for improved quality of life, improved independence with management of low back and continued progress towards therapy goals.   Eval: Patient is a 73 y.o. female who was seen today for physical therapy evaluation and treatment for chronic low back pain.  She presented with low to moderate pain severity and irritability with bilateral hamstring strength testing and lumbar extension and rotation reproducing her familiar symptoms.  She exhibited significant deficits in lumbar active range of motion which result in her abnormal posture and gait deviations.  She is at an elevated fall risk as evidenced by her timed up and go and 5 times sit to stand times in addition to her history of falling.  She was provided a HEP and she was able to properly demonstrate these interventions.  She reported feeling comfortable completing these  interventions at home.  Recommend that she continue with skilled physical therapy to address her impairments to maximize her safety and functional mobility.  OBJECTIVE IMPAIRMENTS: Abnormal gait, decreased activity tolerance, decreased balance, decreased endurance, decreased mobility, difficulty walking, decreased ROM, decreased strength, hypomobility, postural dysfunction, and pain.   ACTIVITY LIMITATIONS: carrying, lifting, bending, standing, transfers, and locomotion level  PARTICIPATION LIMITATIONS: cleaning, laundry, shopping, and community activity  PERSONAL FACTORS: Past/current experiences, Time since onset of injury/illness/exacerbation, and 3+ comorbidities: Anxiety, OA, COPD, history of COVID-19, and history of depression   are also affecting patient's functional outcome.   REHAB POTENTIAL: Fair    CLINICAL DECISION MAKING: Evolving/moderate complexity  EVALUATION COMPLEXITY: Moderate   GOALS: Goals reviewed with patient? Yes  SHORT TERM GOALS: Target date: 08/27/24  Patient will be independent with her initial HEP. Baseline: Goal status: INITIAL  2.  Patient will improve her modified ODI to 15/50 or better for improved perceived function with her daily activities. Baseline:  Goal status: INITIAL  3.  Patient will be able to demonstrate improved lumbar extension to neutral for improved awareness of her surroundings. Baseline:  Goal status: INITIAL  4.  Patient will be able to complete her daily activities without her familiar pain exceeding 8/10. Baseline:  Goal status: INITIAL  LONG TERM GOALS: Target date: 09/17/24  Patient will be independent with her advanced HEP. Baseline:  Goal status: INITIAL  2.  Patient will improve her timed up and go time to 12 seconds or less to reduce her risk of falling. Baseline:  Goal status: INITIAL  3.  Patient will improve her 2-minute walk test distance by at least 50 feet for improved community mobility. Baseline:   Goal status: INITIAL  4.  Patient will improve her 5 times sit to stand time to 12 seconds or less for improved lower extremity power. Baseline:  Goal status: INITIAL  5.  Patient will be able to complete her daily activities without her familiar pain exceeding 6/10. Baseline:  Goal status: INITIAL  PLAN:  PT FREQUENCY: 2x/week  PT DURATION: 6 weeks  PLANNED INTERVENTIONS: 97164- PT Re-evaluation, 97750- Physical Performance Testing, 97110-Therapeutic exercises, 97530- Therapeutic activity, [id number]- Neuromuscular re-education, 97535- Self Care, 02859- Manual therapy, [phone]- Gait training, [phone]- Electrical stimulation (unattended), Patient/Family education, Balance training, Stair training, Taping,  Joint mobilization, Spinal mobilization, Cryotherapy, and Moist heat.  PLAN FOR NEXT SESSION: Postural reeducation, lumbar and lower extremity strengthening, and balance interventions   Lang Ada, PT, DPT [facility] Office: [phone] 2:23 PM, 08/24/24

## 2024-08-31 ENCOUNTER — Encounter (HOSPITAL_COMMUNITY): Payer: Self-pay

## 2024-08-31 ENCOUNTER — Ambulatory Visit (HOSPITAL_COMMUNITY)

## 2024-08-31 DIAGNOSIS — M5459 Other low back pain: Secondary | ICD-10-CM | POA: Diagnosis present

## 2024-08-31 DIAGNOSIS — R2689 Other abnormalities of gait and mobility: Secondary | ICD-10-CM | POA: Insufficient documentation

## 2024-08-31 DIAGNOSIS — R293 Abnormal posture: Secondary | ICD-10-CM | POA: Diagnosis present

## 2024-08-31 NOTE — Therapy (Signed)
 OUTPATIENT PHYSICAL THERAPY THORACOLUMBAR TREATMENT   Patient Name: Rebecca Hansen MRN: [id number] DOB:[birth year], 73 y.o., female Today's Date: 08/31/2024  END OF SESSION:  PT End of Session - 08/31/24 1549     Visit Number 5    Number of Visits 12    Date for Recertification  10/29/24    Authorization Type [employer]    Authorization Time Period 12 approved, 08/06/24-09/17/24    Authorization - Visit Number 4    Authorization - Number of Visits 12    Progress Note Due on Visit 10    PT Start Time 1549    PT Stop Time 1627    PT Time Calculation (min) 38 min    Activity Tolerance Patient tolerated treatment well    Behavior During Therapy WFL for tasks assessed/performed             Past Medical History:  Diagnosis Date   Anxiety    Arthritis    COPD (chronic obstructive pulmonary disease) (HCC)    Depression    Past Surgical History:  Procedure Laterality Date   CATARACT EXTRACTION W/PHACO Left 03/02/2021   Procedure: CATARACT EXTRACTION PHACO AND INTRAOCULAR LENS PLACEMENT (IOC);  Surgeon: Harrie Agent, MD;  Location: [facility];  Service: Ophthalmology;  Laterality: Left;  CDE: 1136   CATARACT EXTRACTION W/PHACO Right 03/30/2021   Procedure: CATARACT EXTRACTION PHACO AND INTRAOCULAR LENS PLACEMENT (IOC);  Surgeon: Harrie Agent, MD;  Location: [facility];  Service: Ophthalmology;  Laterality: Right;  CDE: 13.98   HYSTEROSCOPY     Patient Active Problem List   Diagnosis Date Noted   Intractable vomiting 10/04/2019   Lobar pneumonia 10/03/2019   Thrombocytopenia 10/03/2019   Chronic pain syndrome 10/03/2019   Acute respiratory disease due to COVID-19 virus 10/02/2019   Pneumonia due to COVID-19 virus 10/02/2019   COPD (chronic obstructive pulmonary disease) (HCC) 10/02/2019   Acute on chronic respiratory failure with hypoxia due to Covid pneumonia 10/02/2019   Hyperglycemia, drug-induced 06/20/2015   Abnormal thyroid function test 06/20/2015   Tobacco abuse  06/19/2015   Back pain 06/19/2015   Anxiety 06/19/2015    PCP: Nemiah Kemps, MD  REFERRING PROVIDER: Hyacinth Honey, NP   REFERRING DIAG: back pain   Rationale for Evaluation and Treatment: Rehabilitation  THERAPY DIAG:  Other low back pain  Abnormal posture  Other abnormalities of gait and mobility  ONSET DATE: March-April 2025  SUBJECTIVE:  SUBJECTIVE STATEMENT: Pt states she has been dealing with increased back pain this morning due to increased care for grandchildren. Pt states pain was 8/10 and she took a hydrocodone  and is feeling better now.    Eval: Patient reports that her back has been bothering her for about a year, but she initially injured her back years ago when she was in her 51's. She got a new mattress in late March or early April and she has been having pain since then. She has changed how she sleeps as she no longer sleeps with a pillow between her knees which she used for years. She likes to walk, but she cannot get anyone to walk with her and she does not want to walk alone. However, she laid in bed for about a month after her fiance died earlier this year. She fell off a ladder about 1.5 months ago. She ended up hitting her back on the chair behind her and her arm on the dresser.   PERTINENT HISTORY:  Anxiety, OA, COPD, history of COVID-19, and history of depression    PAIN:  Are you having pain? Yes: NPRS scale: Current: 3/10 Best: 3/10 Worst: 10+/10 Pain location: low back  Pain description: spasms, sharp, stabbing, constant Aggravating factors: bending over,   Relieving factors: medication    PRECAUTIONS: Fall  RED FLAGS: None   WEIGHT BEARING RESTRICTIONS: No  FALLS:  Has patient fallen in last 6 months? Yes. Number of falls 1  LIVING ENVIRONMENT: Lives  with: lives with their daughter Lives in: House/apartment Stairs: Yes: Internal: 15 steps; on left going up; step to pattern Has following equipment at home: shower chair  OCCUPATION: retired  PLOF: Independent  PATIENT GOALS: reduced pain, improved mobility, and be able to stand upright  NEXT MD VISIT: none scheduled, but patient is unsure  OBJECTIVE:  Note: Objective measures were completed at Evaluation unless otherwise noted.  PATIENT SURVEYS:  Modified Oswestry:  MODIFIED OSWESTRY DISABILITY SCALE  Date: 08/06/24 Score  Pain intensity 3 =  Pain medication provides me with moderate relief from pain.  2. Personal care (washing, dressing, etc.) 1 =  I can take care of myself normally, but it increases my pain.  3. Lifting 5 =  I cannot lift or carry anything at all.  4. Walking 3 =  Pain prevents me from walking more than  mile.  5. Sitting 2 =  Pain prevents me from sitting more than 1 hour.  6. Standing 2 =  Pain prevents me from standing more than 1 hour  7. Sleeping 2 =  Even when I take pain medication, I sleep less than 6 hours  8. Social Life 4 =  Pain has restricted my social life to my home  9. Traveling 2 =  My pain restricts my travel over 2 hours.  10. Employment/ Homemaking 2 = I can perform most of my homemaking/job duties, but pain prevents me from performing more physically stressful activities (eg, lifting, vacuuming).  Total 26/50   Interpretation of scores: Score Category Description  0-20% Minimal Disability The patient can cope with most living activities. Usually no treatment is indicated apart from advice on lifting, sitting and exercise  21-40% Moderate Disability The patient experiences more pain and difficulty with sitting, lifting and standing. Travel and social life are more difficult and they may be disabled from work. Personal care, sexual activity and sleeping are not grossly affected, and the patient can usually be managed by conservative means  41-60% Severe Disability Pain remains the main problem in this group, but activities of daily living are affected. These patients require a detailed investigation  61-80% Crippled Back pain impinges on all aspects of the patient's life. Positive intervention is required  81-100% Bed-bound These patients are either bed-bound or exaggerating their symptoms  Bluford FORBES Zoe DELENA Karon DELENA, et al. Surgery versus conservative management of stable thoracolumbar fracture: the PRESTO feasibility RCT. [place] ([place]): [employer]; 2021 Nov. [facility] Technology Assessment, No. 25.62.) Appendix 3, Oswestry Disability Index category descriptors. Available from: Findjewelers.cz  Minimally Clinically Important Difference (MCID) = 12.8%  COGNITION: Overall cognitive status: Within functional limits for tasks assessed     SENSATION: Patient reports intermittent tingling in her back, but none currently.  POSTURE: rounded shoulders, forward head, decreased lumbar lordosis, increased thoracic kyphosis, and flexed trunk   LUMBAR ROM:   AROM eval  Flexion 50% limited  Extension Unable to achieve neutral due to pain   Right lateral flexion   Left lateral flexion   Right rotation 75% limited; familiar pain   Left rotation 75% limited; familiar pain    (Blank rows = not tested)  LOWER EXTREMITY ROM:  WFL for activities assessed  LOWER EXTREMITY MMT:    MMT Right eval Left eval  Hip flexion 3+/5; pulling 3+/5  Hip extension    Hip abduction    Hip adduction    Hip internal rotation    Hip external rotation    Knee flexion 4+/5; familiar pain  4+/5; familiar pain (not as bad as right side)  Knee extension 4+/5 5/5  Ankle dorsiflexion 4-/5 4-/5  Ankle plantarflexion    Ankle inversion    Ankle eversion     (Blank rows = not tested)  FUNCTIONAL TESTS:  5 times sit to stand: 25.43 seconds; intermittent use of armrests Timed up and go (TUG): 15.38  seconds 2 minute walk test: 285 feet; reported bilateral leg pain, but no back pain  GAIT: Distance walked: 285 feet Assistive device utilized: None Level of assistance: Complete Independence Comments: flexed trunk with poor stride length and gait speed  TREATMENT DATE:                                                                                                                               08/31/2024  Manual Therapy: -CPA/UPA mobilizations of Lumbar segments L2-L5, grade II-III  -STM of Lumbar Paraspinal musculature Therapeutic Exercise: -Nustep, 5 minutes, level 4 resistance, pt cued for 70-80 spm -Side lying open book, 1 set of 10 reps bilaterally, pt cued for LE placement and sequencing -Supine bridges, 1 sets of 10 reps, 3 second holds, pt cued for max hip extension, RTB at knees -Double knees to chest, 1 sets of 15 reps, on green theraball, pt cued for max pain free ROM and smooth motion Neuromuscular Re-education: -Core press downs, green theraball, straight and both sides, 1 set of 7 reps 5 second  holds each direction, pt cued for max pain free lumbar ROM  08/24/2024  Therapeutic Exercise: -Supine bridges, 2 sets of 10 reps, 3 second holds, pt cued for max hip extension, on green exercise ball -Double knees to chest, 2 sets of 10 reps, on green theraball, pt cued for max pain free ROM and smooth motion -Thoracic and lumbar extensions in arm chair, 1 set of 5 reps, 10 second holds, pt cued for pain free ROM Neuromuscular Re-education: -Lower trunk rotations, 1 set of 10 reps, bilaterally, pt cued to remain in pain free ROM -Shoulder extensions on black foam, 2 set of 10 reps, GTB at chest level on web slide, pt cued for sequencing  -Side plank, 1 set of 2 reps of 30 second holds, bilaterally, pt cued for increased hip elevation and proper LE/UE placement -Bird dog, 2 set of 7 reps, bilaterally, pt cued for increased hip ROM and neutral spine throughout movement Therapeutic  Activity: -Lateral stepping, 2 laps, 20 feet per lap, with BTB around ankles, pt cued for upright posture and slight bend in knees -Aura carries with 5lb kettle bell, 1 minute each arm, pt cued for core activation and keeping weight close to chest   08/13/2024  Manual Therapy: -CPA/UPA mobilizations of Lumbar segments L2-L5, grade II-III  -STM of Lumbar Paraspinal musculature, pt requests no lotion Therapeutic Exercise: -Nustep, 5 minutes, level 3 resistance, pt cued for 60 spm -Supine bridges, 2 sets of 10 reps, 3 second holds, pt cued for max hip extension -Single knees to chest, 2 sets 30 second holds bilaterally, on green theraball, pt cued for max pain free ROM and smooth motion Neuromuscular Re-education: -Lower trunk rotations, 2 set of 10 reps, bilaterally, pt cued to remain in pain free ROM -Standing balance: narrow BOS EC, semi tandem EC, SLS, EO, 30 second bouts pt cued for decreased  -STS with semi tandem stance, 1 set of 5 reps bilaterally, pt cued for LE placement and core activation upright neutral spine  PATIENT EDUCATION:  Education details: Plan of care, prognosis, objective findings, HEP, and goals for physical therapy Person educated: Patient Education method: Explanation, Demonstration, and Handouts Education comprehension: verbalized understanding and returned demonstration  HOME EXERCISE PROGRAM: Access Code: [email or web address] URL: [email or web address] Date: 08/06/2024 Prepared by: Lacinda Fass  Exercises - Supine Hip Adduction Isometric with Ball  - 1 x daily - 7 x weekly - 3 sets - 10 reps - 5 seconds hold - Seated Hip Adduction Isometrics with Ball  - 1 x daily - 7 x weekly - 3 sets - 10 reps - 5 seconds hold - Supine Bilateral Hamstring Sets  - 1 x daily - 7 x weekly - 3 sets - 10 reps - 5 seconds hold - Seated Hamstring Set  - 1 x daily - 7 x weekly - 3 sets - 10 reps - 5 seconds hold  ASSESSMENT:  CLINICAL IMPRESSION: Patient continues to  demonstrate increased low back pain, decreased LE/core strength, decreased gait quality and balance. Patient also demonstrates increased flexion posture with walking, decreased endurance with aerobic based exercise during today's session with SOB. Patient able to continue dynamic balance and core activation exercises today with core ball pushdowns and bridge variations, good performance with verbal cueing. Patient would continue to benefit from skilled physical therapy for decreased low back pain, decreased flexed posture, increased endurance with ambulation, increased LE/core strength, and improved balance for improved quality of life, improved independence with management of low back pain and continued progress towards  therapy goals.    Eval: Patient is a 73 y.o. female who was seen today for physical therapy evaluation and treatment for chronic low back pain.  She presented with low to moderate pain severity and irritability with bilateral hamstring strength testing and lumbar extension and rotation reproducing her familiar symptoms.  She exhibited significant deficits in lumbar active range of motion which result in her abnormal posture and gait deviations.  She is at an elevated fall risk as evidenced by her timed up and go and 5 times sit to stand times in addition to her history of falling.  She was provided a HEP and she was able to properly demonstrate these interventions.  She reported feeling comfortable completing these interventions at home.  Recommend that she continue with skilled physical therapy to address her impairments to maximize her safety and functional mobility.  OBJECTIVE IMPAIRMENTS: Abnormal gait, decreased activity tolerance, decreased balance, decreased endurance, decreased mobility, difficulty walking, decreased ROM, decreased strength, hypomobility, postural dysfunction, and pain.   ACTIVITY LIMITATIONS: carrying, lifting, bending, standing, transfers, and locomotion  level  PARTICIPATION LIMITATIONS: cleaning, laundry, shopping, and community activity  PERSONAL FACTORS: Past/current experiences, Time since onset of injury/illness/exacerbation, and 3+ comorbidities: Anxiety, OA, COPD, history of COVID-19, and history of depression   are also affecting patient's functional outcome.   REHAB POTENTIAL: Fair    CLINICAL DECISION MAKING: Evolving/moderate complexity  EVALUATION COMPLEXITY: Moderate   GOALS: Goals reviewed with patient? Yes  SHORT TERM GOALS: Target date: 08/27/24  Patient will be independent with her initial HEP. Baseline: Goal status: INITIAL  2.  Patient will improve her modified ODI to 15/50 or better for improved perceived function with her daily activities. Baseline:  Goal status: INITIAL  3.  Patient will be able to demonstrate improved lumbar extension to neutral for improved awareness of her surroundings. Baseline:  Goal status: INITIAL  4.  Patient will be able to complete her daily activities without her familiar pain exceeding 8/10. Baseline:  Goal status: INITIAL  LONG TERM GOALS: Target date: 09/17/24  Patient will be independent with her advanced HEP. Baseline:  Goal status: INITIAL  2.  Patient will improve her timed up and go time to 12 seconds or less to reduce her risk of falling. Baseline:  Goal status: INITIAL  3.  Patient will improve her 2-minute walk test distance by at least 50 feet for improved community mobility. Baseline:  Goal status: INITIAL  4.  Patient will improve her 5 times sit to stand time to 12 seconds or less for improved lower extremity power. Baseline:  Goal status: INITIAL  5.  Patient will be able to complete her daily activities without her familiar pain exceeding 6/10. Baseline:  Goal status: INITIAL  PLAN:  PT FREQUENCY: 2x/week  PT DURATION: 6 weeks  PLANNED INTERVENTIONS: 97164- PT Re-evaluation, 97750- Physical Performance Testing, 97110-Therapeutic exercises,  97530- Therapeutic activity, 97112- Neuromuscular re-education, 97535- Self Care, 02859- Manual therapy, [id number]- Gait training, [id number]- Electrical stimulation (unattended), Patient/Family education, Balance training, Stair training, Taping, Joint mobilization, Spinal mobilization, Cryotherapy, and Moist heat.  PLAN FOR NEXT SESSION: Postural reeducation, lumbar and lower extremity strengthening, and balance interventions   Lang Ada, PT, DPT [facility] Office: [phone] 4:31 PM, 08/31/24

## 2024-09-02 ENCOUNTER — Encounter (HOSPITAL_COMMUNITY): Payer: Self-pay

## 2024-09-02 ENCOUNTER — Ambulatory Visit (HOSPITAL_COMMUNITY)

## 2024-09-02 DIAGNOSIS — R2689 Other abnormalities of gait and mobility: Secondary | ICD-10-CM

## 2024-09-02 DIAGNOSIS — R293 Abnormal posture: Secondary | ICD-10-CM

## 2024-09-02 DIAGNOSIS — M5459 Other low back pain: Secondary | ICD-10-CM

## 2024-09-02 NOTE — Therapy (Signed)
 OUTPATIENT PHYSICAL THERAPY THORACOLUMBAR TREATMENT   Patient Name: Rebecca Hansen MRN: [id number] DOB:December 16, 1950, 73 y.o., female Today's Date: 09/02/2024  END OF SESSION:  PT End of Session - 09/02/24 1421     Visit Number 6    Number of Visits 12    Date for Recertification  10/29/24    Authorization Type [employer]    Authorization Time Period 12 approved, 08/06/24-09/17/24    Authorization - Visit Number 5    Authorization - Number of Visits 12    Progress Note Due on Visit 10    PT Start Time 1421   late arrival   PT Stop Time 1455    PT Time Calculation (min) 34 min    Activity Tolerance Patient tolerated treatment well    Behavior During Therapy WFL for tasks assessed/performed             Past Medical History:  Diagnosis Date   Anxiety    Arthritis    COPD (chronic obstructive pulmonary disease) (HCC)    Depression    Past Surgical History:  Procedure Laterality Date   CATARACT EXTRACTION W/PHACO Left 03/02/2021   Procedure: CATARACT EXTRACTION PHACO AND INTRAOCULAR LENS PLACEMENT (IOC);  Surgeon: Harrie Agent, MD;  Location: [facility];  Service: Ophthalmology;  Laterality: Left;  CDE: 1136   CATARACT EXTRACTION W/PHACO Right 03/30/2021   Procedure: CATARACT EXTRACTION PHACO AND INTRAOCULAR LENS PLACEMENT (IOC);  Surgeon: Harrie Agent, MD;  Location: [facility];  Service: Ophthalmology;  Laterality: Right;  CDE: 13.98   HYSTEROSCOPY     Patient Active Problem List   Diagnosis Date Noted   Intractable vomiting 10/04/2019   Lobar pneumonia 10/03/2019   Thrombocytopenia 10/03/2019   Chronic pain syndrome 10/03/2019   Acute respiratory disease due to COVID-19 virus 10/02/2019   Pneumonia due to COVID-19 virus 10/02/2019   COPD (chronic obstructive pulmonary disease) (HCC) 10/02/2019   Acute on chronic respiratory failure with hypoxia due to Covid pneumonia 10/02/2019   Hyperglycemia, drug-induced 06/20/2015   Abnormal thyroid function test 06/20/2015    Tobacco abuse 06/19/2015   Back pain 06/19/2015   Anxiety 06/19/2015    PCP: Nemiah Kemps, MD  REFERRING PROVIDER: Hyacinth Honey, NP   REFERRING DIAG: back pain   Rationale for Evaluation and Treatment: Rehabilitation  THERAPY DIAG:  Other low back pain  Abnormal posture  Other abnormalities of gait and mobility  ONSET DATE: March-April 2025  SUBJECTIVE:  SUBJECTIVE STATEMENT: Pt states she has been dealing with increased back pain this morning due to increased care for grandchildren. Pt states pain was 8/10 and she took a hydrocodone  and is feeling better now.    Eval: Patient reports that her back has been bothering her for about a year, but she initially injured her back years ago when she was in her 71's. She got a new mattress in late March or early April and she has been having pain since then. She has changed how she sleeps as she no longer sleeps with a pillow between her knees which she used for years. She likes to walk, but she cannot get anyone to walk with her and she does not want to walk alone. However, she laid in bed for about a month after her fiance died earlier this year. She fell off a ladder about 1.5 months ago. She ended up hitting her back on the chair behind her and her arm on the dresser.   PERTINENT HISTORY:  Anxiety, OA, COPD, history of COVID-19, and history of depression    PAIN:  Are you having pain? Yes: NPRS scale: Current: 3/10 Best: 3/10 Worst: 10+/10 Pain location: low back  Pain description: spasms, sharp, stabbing, constant Aggravating factors: bending over,   Relieving factors: medication    PRECAUTIONS: Fall  RED FLAGS: None   WEIGHT BEARING RESTRICTIONS: No  FALLS:  Has patient fallen in last 6 months? Yes. Number of falls 1  LIVING  ENVIRONMENT: Lives with: lives with their daughter Lives in: House/apartment Stairs: Yes: Internal: 15 steps; on left going up; step to pattern Has following equipment at home: shower chair  OCCUPATION: retired  PLOF: Independent  PATIENT GOALS: reduced pain, improved mobility, and be able to stand upright  NEXT MD VISIT: none scheduled, but patient is unsure  OBJECTIVE:  Note: Objective measures were completed at Evaluation unless otherwise noted.  PATIENT SURVEYS:  Modified Oswestry:  MODIFIED OSWESTRY DISABILITY SCALE  Date: 08/06/24 Score  Pain intensity 3 =  Pain medication provides me with moderate relief from pain.  2. Personal care (washing, dressing, etc.) 1 =  I can take care of myself normally, but it increases my pain.  3. Lifting 5 =  I cannot lift or carry anything at all.  4. Walking 3 =  Pain prevents me from walking more than  mile.  5. Sitting 2 =  Pain prevents me from sitting more than 1 hour.  6. Standing 2 =  Pain prevents me from standing more than 1 hour  7. Sleeping 2 =  Even when I take pain medication, I sleep less than 6 hours  8. Social Life 4 =  Pain has restricted my social life to my home  9. Traveling 2 =  My pain restricts my travel over 2 hours.  10. Employment/ Homemaking 2 = I can perform most of my homemaking/job duties, but pain prevents me from performing more physically stressful activities (eg, lifting, vacuuming).  Total 26/50   Interpretation of scores: Score Category Description  0-20% Minimal Disability The patient can cope with most living activities. Usually no treatment is indicated apart from advice on lifting, sitting and exercise  21-40% Moderate Disability The patient experiences more pain and difficulty with sitting, lifting and standing. Travel and social life are more difficult and they may be disabled from work. Personal care, sexual activity and sleeping are not grossly affected, and the patient can usually be managed by  conservative means  41-60% Severe Disability Pain remains the main problem in this group, but activities of daily living are affected. These patients require a detailed investigation  61-80% Crippled Back pain impinges on all aspects of the patient's life. Positive intervention is required  81-100% Bed-bound These patients are either bed-bound or exaggerating their symptoms  Bluford FORBES Zoe DELENA Karon DELENA, et al. Surgery versus conservative management of stable thoracolumbar fracture: the PRESTO feasibility RCT. [place] ([place]): [employer]; 2021 Nov. [facility] Technology Assessment, No. 25.62.) Appendix 3, Oswestry Disability Index category descriptors. Available from: Findjewelers.cz  Minimally Clinically Important Difference (MCID) = 12.8%  COGNITION: Overall cognitive status: Within functional limits for tasks assessed     SENSATION: Patient reports intermittent tingling in her back, but none currently.  POSTURE: rounded shoulders, forward head, decreased lumbar lordosis, increased thoracic kyphosis, and flexed trunk   LUMBAR ROM:   AROM eval  Flexion 50% limited  Extension Unable to achieve neutral due to pain   Right lateral flexion   Left lateral flexion   Right rotation 75% limited; familiar pain   Left rotation 75% limited; familiar pain    (Blank rows = not tested)  LOWER EXTREMITY ROM:  WFL for activities assessed  LOWER EXTREMITY MMT:    MMT Right eval Left eval  Hip flexion 3+/5; pulling 3+/5  Hip extension    Hip abduction    Hip adduction    Hip internal rotation    Hip external rotation    Knee flexion 4+/5; familiar pain  4+/5; familiar pain (not as bad as right side)  Knee extension 4+/5 5/5  Ankle dorsiflexion 4-/5 4-/5  Ankle plantarflexion    Ankle inversion    Ankle eversion     (Blank rows = not tested)  FUNCTIONAL TESTS:  5 times sit to stand: 25.43 seconds; intermittent use of armrests Timed up and  go (TUG): 15.38 seconds 2 minute walk test: 285 feet; reported bilateral leg pain, but no back pain  GAIT: Distance walked: 285 feet Assistive device utilized: None Level of assistance: Complete Independence Comments: flexed trunk with poor stride length and gait speed  TREATMENT DATE:                                                                                                                               09/02/2024  Manual Therapy: -CPA/UPA mobilizations of Lumbar segments L2-L5, grade III-IV -STM of Lumbar Paraspinal musculature, tightness noted bilaterally Therapeutic Exercise: -Straight leg raises, 2 sets 8 reps, pt cued for controlled movement -Supine bridges, 1 sets of 10 reps, 3 second holds, pt cued for max hip extension, RTB at knees -Side lying clamshells, 2 sets of 10 reps bilaterally, pt cued for eccentric control and   -Single knees to chest, 1 sets of 2 reps, 20 second holds, on green theraball, pt cued for max pain free ROM and smooth motion   08/31/2024  Manual Therapy: -CPA/UPA mobilizations of Lumbar segments L2-L5,  grade II-III  -STM of Lumbar Paraspinal musculature Therapeutic Exercise: -Nustep, 5 minutes, level 4 resistance, pt cued for 70-80 spm -Side lying open book, 1 set of 10 reps bilaterally, pt cued for LE placement and sequencing -Supine bridges, 1 sets of 10 reps, 3 second holds, pt cued for max hip extension, RTB at knees -Double knees to chest, 1 sets of 15 reps, on green theraball, pt cued for max pain free ROM and smooth motion Neuromuscular Re-education: -Core press downs, green theraball, straight and both sides, 1 set of 7 reps 5 second holds each direction, pt cued for max pain free lumbar ROM  08/24/2024  Therapeutic Exercise: -Supine bridges, 2 sets of 10 reps, 3 second holds, pt cued for max hip extension, on green exercise ball -Double knees to chest, 2 sets of 10 reps, on green theraball, pt cued for max pain free ROM and smooth  motion -Thoracic and lumbar extensions in arm chair, 1 set of 5 reps, 10 second holds, pt cued for pain free ROM Neuromuscular Re-education: -Lower trunk rotations, 1 set of 10 reps, bilaterally, pt cued to remain in pain free ROM -Shoulder extensions on black foam, 2 set of 10 reps, GTB at chest level on web slide, pt cued for sequencing  -Side plank, 1 set of 2 reps of 30 second holds, bilaterally, pt cued for increased hip elevation and proper LE/UE placement -Bird dog, 2 set of 7 reps, bilaterally, pt cued for increased hip ROM and neutral spine throughout movement Therapeutic Activity: -Lateral stepping, 2 laps, 20 feet per lap, with BTB around ankles, pt cued for upright posture and slight bend in knees -Farmer carries with 5lb kettle bell, 1 minute each arm, pt cued for core activation and keeping weight close to chest    PATIENT EDUCATION:  Education details: Plan of care, prognosis, objective findings, HEP, and goals for physical therapy Person educated: Patient Education method: Explanation, Demonstration, and Handouts Education comprehension: verbalized understanding and returned demonstration  HOME EXERCISE PROGRAM: Access Code: [email or web address] URL: [email or web address] Date: 08/06/2024 Prepared by: Lacinda Fass  Exercises - Supine Hip Adduction Isometric with Ball  - 1 x daily - 7 x weekly - 3 sets - 10 reps - 5 seconds hold - Seated Hip Adduction Isometrics with Ball  - 1 x daily - 7 x weekly - 3 sets - 10 reps - 5 seconds hold - Supine Bilateral Hamstring Sets  - 1 x daily - 7 x weekly - 3 sets - 10 reps - 5 seconds hold - Seated Hamstring Set  - 1 x daily - 7 x weekly - 3 sets - 10 reps - 5 seconds hold  ASSESSMENT:  CLINICAL IMPRESSION: Patient continues to demonstrate low back pain, decreased LE/core strength, decreased gait quality and difficulty with bed mobility. Patient also demonstrates increased tolerance with manual therapy during today's session  grade III and IV. Patient able to continue dynamic balance and core activation exercises today with bridge variations and SLR, good performance with verbal cueing. Patient would continue to benefit from skilled physical therapy for decreased low back pain, increased endurance with ambulation, increased LE/core strength, and improved balance for improved quality of life, improved independence with management of low back pain and continued progress towards therapy goals.     Eval: Patient is a 73 y.o. female who was seen today for physical therapy evaluation and treatment for chronic low back pain.  She presented with low to moderate pain severity and irritability with bilateral hamstring strength  testing and lumbar extension and rotation reproducing her familiar symptoms.  She exhibited significant deficits in lumbar active range of motion which result in her abnormal posture and gait deviations.  She is at an elevated fall risk as evidenced by her timed up and go and 5 times sit to stand times in addition to her history of falling.  She was provided a HEP and she was able to properly demonstrate these interventions.  She reported feeling comfortable completing these interventions at home.  Recommend that she continue with skilled physical therapy to address her impairments to maximize her safety and functional mobility.  OBJECTIVE IMPAIRMENTS: Abnormal gait, decreased activity tolerance, decreased balance, decreased endurance, decreased mobility, difficulty walking, decreased ROM, decreased strength, hypomobility, postural dysfunction, and pain.   ACTIVITY LIMITATIONS: carrying, lifting, bending, standing, transfers, and locomotion level  PARTICIPATION LIMITATIONS: cleaning, laundry, shopping, and community activity  PERSONAL FACTORS: Past/current experiences, Time since onset of injury/illness/exacerbation, and 3+ comorbidities: Anxiety, OA, COPD, history of COVID-19, and history of depression   are  also affecting patient's functional outcome.   REHAB POTENTIAL: Fair    CLINICAL DECISION MAKING: Evolving/moderate complexity  EVALUATION COMPLEXITY: Moderate   GOALS: Goals reviewed with patient? Yes  SHORT TERM GOALS: Target date: 08/27/24  Patient will be independent with her initial HEP. Baseline: Goal status: INITIAL  2.  Patient will improve her modified ODI to 15/50 or better for improved perceived function with her daily activities. Baseline:  Goal status: INITIAL  3.  Patient will be able to demonstrate improved lumbar extension to neutral for improved awareness of her surroundings. Baseline:  Goal status: INITIAL  4.  Patient will be able to complete her daily activities without her familiar pain exceeding 8/10. Baseline:  Goal status: INITIAL  LONG TERM GOALS: Target date: 09/17/24  Patient will be independent with her advanced HEP. Baseline:  Goal status: INITIAL  2.  Patient will improve her timed up and go time to 12 seconds or less to reduce her risk of falling. Baseline:  Goal status: INITIAL  3.  Patient will improve her 2-minute walk test distance by at least 50 feet for improved community mobility. Baseline:  Goal status: INITIAL  4.  Patient will improve her 5 times sit to stand time to 12 seconds or less for improved lower extremity power. Baseline:  Goal status: INITIAL  5.  Patient will be able to complete her daily activities without her familiar pain exceeding 6/10. Baseline:  Goal status: INITIAL  PLAN:  PT FREQUENCY: 2x/week  PT DURATION: 6 weeks  PLANNED INTERVENTIONS: 97164- PT Re-evaluation, 97750- Physical Performance Testing, 97110-Therapeutic exercises, 97530- Therapeutic activity, 97112- Neuromuscular re-education, 97535- Self Care, [id number]- Manual therapy, [phone]- Gait training, [phone]- Electrical stimulation (unattended), Patient/Family education, Balance training, Stair training, Taping, Joint mobilization, Spinal  mobilization, Cryotherapy, and Moist heat.  PLAN FOR NEXT SESSION: Postural reeducation, lumbar and lower extremity strengthening, and balance interventions   Lang Ada, PT, DPT [facility] Office: [phone] 2:58 PM, 09/02/24

## 2024-09-07 ENCOUNTER — Ambulatory Visit (HOSPITAL_COMMUNITY): Admitting: Physical Therapy

## 2024-09-07 DIAGNOSIS — M5459 Other low back pain: Secondary | ICD-10-CM | POA: Diagnosis not present

## 2024-09-07 DIAGNOSIS — R2689 Other abnormalities of gait and mobility: Secondary | ICD-10-CM

## 2024-09-07 DIAGNOSIS — R293 Abnormal posture: Secondary | ICD-10-CM

## 2024-09-07 NOTE — Therapy (Signed)
 OUTPATIENT PHYSICAL THERAPY THORACOLUMBAR TREATMENT   Patient Name: Rebecca Hansen MRN: [id number] DOB:01/24/1951, 73 y.o., female Today's Date: 09/07/2024  END OF SESSION:  PT End of Session - 09/07/24 1424     Visit Number 7    Number of Visits 12    Date for Recertification  10/29/24    Authorization Type [employer]    Authorization Time Period 12 approved, 08/06/24-09/17/24    Authorization - Number of Visits 12    Progress Note Due on Visit 10    PT Start Time 1338    PT Stop Time 1425    PT Time Calculation (min) 47 min    Activity Tolerance Patient tolerated treatment well    Behavior During Therapy WFL for tasks assessed/performed              Past Medical History:  Diagnosis Date   Anxiety    Arthritis    COPD (chronic obstructive pulmonary disease) (HCC)    Depression    Past Surgical History:  Procedure Laterality Date   CATARACT EXTRACTION W/PHACO Left 03/02/2021   Procedure: CATARACT EXTRACTION PHACO AND INTRAOCULAR LENS PLACEMENT (IOC);  Surgeon: Harrie Agent, MD;  Location: [facility];  Service: Ophthalmology;  Laterality: Left;  CDE: 1136   CATARACT EXTRACTION W/PHACO Right 03/30/2021   Procedure: CATARACT EXTRACTION PHACO AND INTRAOCULAR LENS PLACEMENT (IOC);  Surgeon: Harrie Agent, MD;  Location: [facility];  Service: Ophthalmology;  Laterality: Right;  CDE: 13.98   HYSTEROSCOPY     Patient Active Problem List   Diagnosis Date Noted   Intractable vomiting 10/04/2019   Lobar pneumonia 10/03/2019   Thrombocytopenia 10/03/2019   Chronic pain syndrome 10/03/2019   Acute respiratory disease due to COVID-19 virus 10/02/2019   Pneumonia due to COVID-19 virus 10/02/2019   COPD (chronic obstructive pulmonary disease) (HCC) 10/02/2019   Acute on chronic respiratory failure with hypoxia due to Covid pneumonia 10/02/2019   Hyperglycemia, drug-induced 06/20/2015   Abnormal thyroid function test 06/20/2015   Tobacco abuse 06/19/2015   Back pain 06/19/2015    Anxiety 06/19/2015    PCP: Nemiah Kemps, MD  REFERRING PROVIDER: Hyacinth Honey, NP   REFERRING DIAG: back pain   Rationale for Evaluation and Treatment: Rehabilitation  THERAPY DIAG:  No diagnosis found.  ONSET DATE: March-April 2025  SUBJECTIVE:                                                                                                                                                                                           SUBJECTIVE STATEMENT: Pt states she was hurting really bad until she took her pain pill . Currently  5/10.  Reports compliance with HEP stating the knee to chest she feels has helped the most.      Eval: Patient reports that her back has been bothering her for about a year, but she initially injured her back years ago when she was in her 80's. She got a new mattress in late March or early April and she has been having pain since then. She has changed how she sleeps as she no longer sleeps with a pillow between her knees which she used for years. She likes to walk, but she cannot get anyone to walk with her and she does not want to walk alone. However, she laid in bed for about a month after her fiance died earlier this year. She fell off a ladder about 1.5 months ago. She ended up hitting her back on the chair behind her and her arm on the dresser.   PERTINENT HISTORY:  Anxiety, OA, COPD, history of COVID-19, and history of depression    PAIN:  Are you having pain? Yes: NPRS scale: Current: 3/10 Best: 3/10 Worst: 10+/10 Pain location: low back  Pain description: spasms, sharp, stabbing, constant Aggravating factors: bending over,   Relieving factors: medication    PRECAUTIONS: Fall  RED FLAGS: None   WEIGHT BEARING RESTRICTIONS: No  FALLS:  Has patient fallen in last 6 months? Yes. Number of falls 1  LIVING ENVIRONMENT: Lives with: lives with their daughter Lives in: House/apartment Stairs: Yes: Internal: 15 steps; on left going up; step to  pattern Has following equipment at home: shower chair  OCCUPATION: retired  PLOF: Independent  PATIENT GOALS: reduced pain, improved mobility, and be able to stand upright  NEXT MD VISIT: none scheduled, but patient is unsure  OBJECTIVE:  Note: Objective measures were completed at Evaluation unless otherwise noted.  PATIENT SURVEYS:  Modified Oswestry:  MODIFIED OSWESTRY DISABILITY SCALE  Date: 08/06/24 Score  Pain intensity 3 =  Pain medication provides me with moderate relief from pain.  2. Personal care (washing, dressing, etc.) 1 =  I can take care of myself normally, but it increases my pain.  3. Lifting 5 =  I cannot lift or carry anything at all.  4. Walking 3 =  Pain prevents me from walking more than  mile.  5. Sitting 2 =  Pain prevents me from sitting more than 1 hour.  6. Standing 2 =  Pain prevents me from standing more than 1 hour  7. Sleeping 2 =  Even when I take pain medication, I sleep less than 6 hours  8. Social Life 4 =  Pain has restricted my social life to my home  9. Traveling 2 =  My pain restricts my travel over 2 hours.  10. Employment/ Homemaking 2 = I can perform most of my homemaking/job duties, but pain prevents me from performing more physically stressful activities (eg, lifting, vacuuming).  Total 26/50   Interpretation of scores: Score Category Description  0-20% Minimal Disability The patient can cope with most living activities. Usually no treatment is indicated apart from advice on lifting, sitting and exercise  21-40% Moderate Disability The patient experiences more pain and difficulty with sitting, lifting and standing. Travel and social life are more difficult and they may be disabled from work. Personal care, sexual activity and sleeping are not grossly affected, and the patient can usually be managed by conservative means  41-60% Severe Disability Pain remains the main problem in this group, but activities of daily living  are affected. These  patients require a detailed investigation  61-80% Crippled Back pain impinges on all aspects of the patient's life. Positive intervention is required  81-100% Bed-bound These patients are either bed-bound or exaggerating their symptoms  Bluford FORBES Zoe DELENA Karon DELENA, et al. Surgery versus conservative management of stable thoracolumbar fracture: the PRESTO feasibility RCT. [place] ([place]): [employer]; 2021 Nov. [facility] Technology Assessment, No. 25.62.) Appendix 3, Oswestry Disability Index category descriptors. Available from: Findjewelers.cz  Minimally Clinically Important Difference (MCID) = 12.8%  COGNITION: Overall cognitive status: Within functional limits for tasks assessed     SENSATION: Patient reports intermittent tingling in her back, but none currently.  POSTURE: rounded shoulders, forward head, decreased lumbar lordosis, increased thoracic kyphosis, and flexed trunk   LUMBAR ROM:   AROM eval  Flexion 50% limited  Extension Unable to achieve neutral due to pain   Right lateral flexion   Left lateral flexion   Right rotation 75% limited; familiar pain   Left rotation 75% limited; familiar pain    (Blank rows = not tested)  LOWER EXTREMITY ROM:  WFL for activities assessed  LOWER EXTREMITY MMT:    MMT Right eval Left eval  Hip flexion 3+/5; pulling 3+/5  Hip extension    Hip abduction    Hip adduction    Hip internal rotation    Hip external rotation    Knee flexion 4+/5; familiar pain  4+/5; familiar pain (not as bad as right side)  Knee extension 4+/5 5/5  Ankle dorsiflexion 4-/5 4-/5  Ankle plantarflexion    Ankle inversion    Ankle eversion     (Blank rows = not tested)  FUNCTIONAL TESTS:  5 times sit to stand: 25.43 seconds; intermittent use of armrests Timed up and go (TUG): 15.38 seconds 2 minute walk test: 285 feet; reported bilateral leg pain, but no back pain  GAIT: Distance walked: 285  feet Assistive device utilized: None Level of assistance: Complete Independence Comments: flexed trunk with poor stride length and gait speed  TREATMENT DATE:                                                                                                                               09/07/24 Supine on moist heat:  Abdominal bracing 10X5  SKTC 10X 10 holds each LE  Bridging 10X5  SLR 2X10 each LE Standing: lumbar extension against counter 10X   09/02/2024  Manual Therapy: -CPA/UPA mobilizations of Lumbar segments L2-L5, grade III-IV -STM of Lumbar Paraspinal musculature, tightness noted bilaterally Therapeutic Exercise: -Straight leg raises, 2 sets 8 reps, pt cued for controlled movement -Supine bridges, 1 sets of 10 reps, 3 second holds, pt cued for max hip extension, RTB at knees -Side lying clamshells, 2 sets of 10 reps bilaterally, pt cued for eccentric control and   -Single knees to chest, 1 sets of 2 reps, 20 second holds, on green theraball, pt cued for max pain  free ROM and smooth motion   08/31/2024  Manual Therapy: -CPA/UPA mobilizations of Lumbar segments L2-L5, grade II-III  -STM of Lumbar Paraspinal musculature Therapeutic Exercise: -Nustep, 5 minutes, level 4 resistance, pt cued for 70-80 spm -Side lying open book, 1 set of 10 reps bilaterally, pt cued for LE placement and sequencing -Supine bridges, 1 sets of 10 reps, 3 second holds, pt cued for max hip extension, RTB at knees -Double knees to chest, 1 sets of 15 reps, on green theraball, pt cued for max pain free ROM and smooth motion Neuromuscular Re-education: -Core press downs, green theraball, straight and both sides, 1 set of 7 reps 5 second holds each direction, pt cued for max pain free lumbar ROM  08/24/2024  Therapeutic Exercise: -Supine bridges, 2 sets of 10 reps, 3 second holds, pt cued for max hip extension, on green exercise ball -Double knees to chest, 2 sets of 10 reps, on green theraball, pt  cued for max pain free ROM and smooth motion -Thoracic and lumbar extensions in arm chair, 1 set of 5 reps, 10 second holds, pt cued for pain free ROM Neuromuscular Re-education: -Lower trunk rotations, 1 set of 10 reps, bilaterally, pt cued to remain in pain free ROM -Shoulder extensions on black foam, 2 set of 10 reps, GTB at chest level on web slide, pt cued for sequencing  -Side plank, 1 set of 2 reps of 30 second holds, bilaterally, pt cued for increased hip elevation and proper LE/UE placement -Bird dog, 2 set of 7 reps, bilaterally, pt cued for increased hip ROM and neutral spine throughout movement Therapeutic Activity: -Lateral stepping, 2 laps, 20 feet per lap, with BTB around ankles, pt cued for upright posture and slight bend in knees -Farmer carries with 5lb kettle bell, 1 minute each arm, pt cued for core activation and keeping weight close to chest    PATIENT EDUCATION:  Education details: Plan of care, prognosis, objective findings, HEP, and goals for physical therapy Person educated: Patient Education method: Explanation, Demonstration, and Handouts Education comprehension: verbalized understanding and returned demonstration  HOME EXERCISE PROGRAM: Access Code: [email or web address] URL: [email or web address] Date: 08/06/2024 Prepared by: Lacinda Fass  Exercises - Supine Hip Adduction Isometric with Ball  - 1 x daily - 7 x weekly - 3 sets - 10 reps - 5 seconds hold - Seated Hip Adduction Isometrics with Ball  - 1 x daily - 7 x weekly - 3 sets - 10 reps - 5 seconds hold - Supine Bilateral Hamstring Sets  - 1 x daily - 7 x weekly - 3 sets - 10 reps - 5 seconds hold - Seated Hamstring Set  - 1 x daily - 7 x weekly - 3 sets - 10 reps - 5 seconds hold  ASSESSMENT:  CLINICAL IMPRESSION: Completed supine core stabilization and LE stretches while lying on moist heat today.  Pt reported being painfree at end of session today . Pt was able to recall exercises appropriately  and introduced to abdominal bracing and importance of this.  Educated and instructed with logroll technique for supine to/from sit transfers as noted to just lay straight down and sit straight up.  Pt able to complete with increased ease.  Standing extensions began with bottom/lumbar area against counter.  Pt able to complete this with reduced pain during/following.  Patient will continue to benefit from skilled physical therapy for decreased low back pain, increased endurance with ambulation, increased LE/core strength, and improved balance for improved quality of life, improved  independence with management of low back pain and continued progress towards therapy goals.   Eval: Patient is a 73 y.o. female who was seen today for physical therapy evaluation and treatment for chronic low back pain.  She presented with low to moderate pain severity and irritability with bilateral hamstring strength testing and lumbar extension and rotation reproducing her familiar symptoms.  She exhibited significant deficits in lumbar active range of motion which result in her abnormal posture and gait deviations.  She is at an elevated fall risk as evidenced by her timed up and go and 5 times sit to stand times in addition to her history of falling.  She was provided a HEP and she was able to properly demonstrate these interventions.  She reported feeling comfortable completing these interventions at home.  Recommend that she continue with skilled physical therapy to address her impairments to maximize her safety and functional mobility.  OBJECTIVE IMPAIRMENTS: Abnormal gait, decreased activity tolerance, decreased balance, decreased endurance, decreased mobility, difficulty walking, decreased ROM, decreased strength, hypomobility, postural dysfunction, and pain.   ACTIVITY LIMITATIONS: carrying, lifting, bending, standing, transfers, and locomotion level  PARTICIPATION LIMITATIONS: cleaning, laundry, shopping, and community  activity  PERSONAL FACTORS: Past/current experiences, Time since onset of injury/illness/exacerbation, and 3+ comorbidities: Anxiety, OA, COPD, history of COVID-19, and history of depression   are also affecting patient's functional outcome.   REHAB POTENTIAL: Fair    CLINICAL DECISION MAKING: Evolving/moderate complexity  EVALUATION COMPLEXITY: Moderate   GOALS: Goals reviewed with patient? Yes  SHORT TERM GOALS: Target date: 08/27/24  Patient will be independent with her initial HEP. Baseline: Goal status: INITIAL  2.  Patient will improve her modified ODI to 15/50 or better for improved perceived function with her daily activities. Baseline:  Goal status: INITIAL  3.  Patient will be able to demonstrate improved lumbar extension to neutral for improved awareness of her surroundings. Baseline:  Goal status: INITIAL  4.  Patient will be able to complete her daily activities without her familiar pain exceeding 8/10. Baseline:  Goal status: INITIAL  LONG TERM GOALS: Target date: 09/17/24  Patient will be independent with her advanced HEP. Baseline:  Goal status: INITIAL  2.  Patient will improve her timed up and go time to 12 seconds or less to reduce her risk of falling. Baseline:  Goal status: INITIAL  3.  Patient will improve her 2-minute walk test distance by at least 50 feet for improved community mobility. Baseline:  Goal status: INITIAL  4.  Patient will improve her 5 times sit to stand time to 12 seconds or less for improved lower extremity power. Baseline:  Goal status: INITIAL  5.  Patient will be able to complete her daily activities without her familiar pain exceeding 6/10. Baseline:  Goal status: INITIAL  PLAN:  PT FREQUENCY: 2x/week  PT DURATION: 6 weeks  PLANNED INTERVENTIONS: 97164- PT Re-evaluation, 97750- Physical Performance Testing, 97110-Therapeutic exercises, 97530- Therapeutic activity, 97112- Neuromuscular re-education, 97535- Self  Care, 02859- Manual therapy, [phone]- Gait training, [phone]- Electrical stimulation (unattended), Patient/Family education, Balance training, Stair training, Taping, Joint mobilization, Spinal mobilization, Cryotherapy, and Moist heat.  PLAN FOR NEXT SESSION: Postural reeducation, lumbar and lower extremity strengthening, and balance interventions   Greig KATHEE Fuse, PTA/CLT [facility] Ph: [phone] 2:25 PM, 09/07/24

## 2024-09-09 ENCOUNTER — Ambulatory Visit (HOSPITAL_COMMUNITY): Admitting: Physical Therapy

## 2024-09-09 DIAGNOSIS — M5459 Other low back pain: Secondary | ICD-10-CM

## 2024-09-09 DIAGNOSIS — R2689 Other abnormalities of gait and mobility: Secondary | ICD-10-CM

## 2024-09-09 DIAGNOSIS — R293 Abnormal posture: Secondary | ICD-10-CM

## 2024-09-09 NOTE — Therapy (Signed)
 OUTPATIENT PHYSICAL THERAPY THORACOLUMBAR TREATMENT   Patient Name: Rebecca Hansen MRN: [id number] DOB:1950/12/08, 73 y.o., female Today's Date: 09/09/2024  END OF SESSION:  PT End of Session - 09/09/24 1445     Visit Number 8    Number of Visits 12    Date for Recertification  10/29/24    Authorization Type [employer]    Authorization Time Period 12 approved, 08/06/24-09/17/24    Authorization - Visit Number 7    Authorization - Number of Visits 12    Progress Note Due on Visit 10    PT Start Time 1420    PT Stop Time 1500    PT Time Calculation (min) 40 min    Activity Tolerance Patient tolerated treatment well    Behavior During Therapy WFL for tasks assessed/performed               Past Medical History:  Diagnosis Date   Anxiety    Arthritis    COPD (chronic obstructive pulmonary disease) (HCC)    Depression    Past Surgical History:  Procedure Laterality Date   CATARACT EXTRACTION W/PHACO Left 03/02/2021   Procedure: CATARACT EXTRACTION PHACO AND INTRAOCULAR LENS PLACEMENT (IOC);  Surgeon: Harrie Agent, MD;  Location: [facility];  Service: Ophthalmology;  Laterality: Left;  CDE: 1136   CATARACT EXTRACTION W/PHACO Right 03/30/2021   Procedure: CATARACT EXTRACTION PHACO AND INTRAOCULAR LENS PLACEMENT (IOC);  Surgeon: Harrie Agent, MD;  Location: [facility];  Service: Ophthalmology;  Laterality: Right;  CDE: 13.98   HYSTEROSCOPY     Patient Active Problem List   Diagnosis Date Noted   Intractable vomiting 10/04/2019   Lobar pneumonia 10/03/2019   Thrombocytopenia 10/03/2019   Chronic pain syndrome 10/03/2019   Acute respiratory disease due to COVID-19 virus 10/02/2019   Pneumonia due to COVID-19 virus 10/02/2019   COPD (chronic obstructive pulmonary disease) (HCC) 10/02/2019   Acute on chronic respiratory failure with hypoxia due to Covid pneumonia 10/02/2019   Hyperglycemia, drug-induced 06/20/2015   Abnormal thyroid function test 06/20/2015   Tobacco abuse  06/19/2015   Back pain 06/19/2015   Anxiety 06/19/2015    PCP: Nemiah Kemps, MD  REFERRING PROVIDER: Hyacinth Honey, NP   REFERRING DIAG: back pain   Rationale for Evaluation and Treatment: Rehabilitation  THERAPY DIAG:  Other low back pain  Abnormal posture  Other abnormalities of gait and mobility  ONSET DATE: March-April 2025  SUBJECTIVE:  SUBJECTIVE STATEMENT: Pt reports she's been doing the exercises and reports she is hurting really bad today.  Took pain pill 1 hour ago and has not helped as her pain is 10/10. Pt denies need to go to ED.      Eval: Patient reports that her back has been bothering her for about a year, but she initially injured her back years ago when she was in her 22's. She got a new mattress in late March or early April and she has been having pain since then. She has changed how she sleeps as she no longer sleeps with a pillow between her knees which she used for years. She likes to walk, but she cannot get anyone to walk with her and she does not want to walk alone. However, she laid in bed for about a month after her fiance died earlier this year. She fell off a ladder about 1.5 months ago. She ended up hitting her back on the chair behind her and her arm on the dresser.   PERTINENT HISTORY:  Anxiety, OA, COPD, history of COVID-19, and history of depression    PAIN:  Are you having pain? Yes: NPRS scale: Current: 10/10 Pain location: low back  Pain description: spasms, sharp, stabbing, constant Aggravating factors: bending over,   Relieving factors: medication    PRECAUTIONS: Fall  RED FLAGS: None   WEIGHT BEARING RESTRICTIONS: No  FALLS:  Has patient fallen in last 6 months? Yes. Number of falls 1  LIVING ENVIRONMENT: Lives with: lives with their  daughter Lives in: House/apartment Stairs: Yes: Internal: 15 steps; on left going up; step to pattern Has following equipment at home: shower chair  OCCUPATION: retired  PLOF: Independent  PATIENT GOALS: reduced pain, improved mobility, and be able to stand upright  NEXT MD VISIT: none scheduled, but patient is unsure  OBJECTIVE:  Note: Objective measures were completed at Evaluation unless otherwise noted.  PATIENT SURVEYS:  Modified Oswestry:  MODIFIED OSWESTRY DISABILITY SCALE  Date: 08/06/24 Score  Pain intensity 3 =  Pain medication provides me with moderate relief from pain.  2. Personal care (washing, dressing, etc.) 1 =  I can take care of myself normally, but it increases my pain.  3. Lifting 5 =  I cannot lift or carry anything at all.  4. Walking 3 =  Pain prevents me from walking more than  mile.  5. Sitting 2 =  Pain prevents me from sitting more than 1 hour.  6. Standing 2 =  Pain prevents me from standing more than 1 hour  7. Sleeping 2 =  Even when I take pain medication, I sleep less than 6 hours  8. Social Life 4 =  Pain has restricted my social life to my home  9. Traveling 2 =  My pain restricts my travel over 2 hours.  10. Employment/ Homemaking 2 = I can perform most of my homemaking/job duties, but pain prevents me from performing more physically stressful activities (eg, lifting, vacuuming).  Total 26/50   Interpretation of scores: Score Category Description  0-20% Minimal Disability The patient can cope with most living activities. Usually no treatment is indicated apart from advice on lifting, sitting and exercise  21-40% Moderate Disability The patient experiences more pain and difficulty with sitting, lifting and standing. Travel and social life are more difficult and they may be disabled from work. Personal care, sexual activity and sleeping are not grossly affected, and the patient can usually be managed by  conservative means  41-60% Severe Disability  Pain remains the main problem in this group, but activities of daily living are affected. These patients require a detailed investigation  61-80% Crippled Back pain impinges on all aspects of the patients life. Positive intervention is required  81-100% Bed-bound These patients are either bed-bound or exaggerating their symptoms  Bluford FORBES Zoe DELENA Karon DELENA, et al. Surgery versus conservative management of stable thoracolumbar fracture: the PRESTO feasibility RCT. [place] ([place]): [employer]; 2021 Nov. [facility] Technology Assessment, No. 25.62.) Appendix 3, Oswestry Disability Index category descriptors. Available from: Findjewelers.cz  Minimally Clinically Important Difference (MCID) = 12.8%  COGNITION: Overall cognitive status: Within functional limits for tasks assessed     SENSATION: Patient reports intermittent tingling in her back, but none currently.  POSTURE: rounded shoulders, forward head, decreased lumbar lordosis, increased thoracic kyphosis, and flexed trunk   LUMBAR ROM:   AROM eval  Flexion 50% limited  Extension Unable to achieve neutral due to pain   Right lateral flexion   Left lateral flexion   Right rotation 75% limited; familiar pain   Left rotation 75% limited; familiar pain    (Blank rows = not tested)  LOWER EXTREMITY ROM:  WFL for activities assessed  LOWER EXTREMITY MMT:    MMT Right eval Left eval  Hip flexion 3+/5; pulling 3+/5  Hip extension    Hip abduction    Hip adduction    Hip internal rotation    Hip external rotation    Knee flexion 4+/5; familiar pain  4+/5; familiar pain (not as bad as right side)  Knee extension 4+/5 5/5  Ankle dorsiflexion 4-/5 4-/5  Ankle plantarflexion    Ankle inversion    Ankle eversion     (Blank rows = not tested)  FUNCTIONAL TESTS:  5 times sit to stand: 25.43 seconds; intermittent use of armrests Timed up and go (TUG): 15.38 seconds 2 minute walk test:  285 feet; reported bilateral leg pain, but no back pain  GAIT: Distance walked: 285 feet Assistive device utilized: None Level of assistance: Complete Independence Comments: flexed trunk with poor stride length and gait speed  TREATMENT DATE:                                                                                                                               09/09/24 Pt denies heat as she is too hot Standing:  lumbar extension against counter  UE flexion against wall stretch 10X10 Walking with walker around clinic (pt much improved posture) Walking with SPC (per request rather than walker)  09/07/24 Supine on moist heat:  Abdominal bracing 10X5  SKTC 10X 10 holds each LE  Bridging 10X5  SLR 2X10 each LE Standing: lumbar extension against counter 10X   09/02/2024  Manual Therapy: -CPA/UPA mobilizations of Lumbar segments L2-L5, grade III-IV -STM of Lumbar Paraspinal musculature, tightness noted bilaterally Therapeutic Exercise: -Straight leg raises, 2 sets 8 reps, pt cued  for controlled movement -Supine bridges, 1 sets of 10 reps, 3 second holds, pt cued for max hip extension, RTB at knees -Side lying clamshells, 2 sets of 10 reps bilaterally, pt cued for eccentric control and   -Single knees to chest, 1 sets of 2 reps, 20 second holds, on green theraball, pt cued for max pain free ROM and smooth motion   08/31/2024  Manual Therapy: -CPA/UPA mobilizations of Lumbar segments L2-L5, grade II-III  -STM of Lumbar Paraspinal musculature Therapeutic Exercise: -Nustep, 5 minutes, level 4 resistance, pt cued for 70-80 spm -Side lying open book, 1 set of 10 reps bilaterally, pt cued for LE placement and sequencing -Supine bridges, 1 sets of 10 reps, 3 second holds, pt cued for max hip extension, RTB at knees -Double knees to chest, 1 sets of 15 reps, on green theraball, pt cued for max pain free ROM and smooth motion Neuromuscular Re-education: -Core press downs, green  theraball, straight and both sides, 1 set of 7 reps 5 second holds each direction, pt cued for max pain free lumbar ROM  08/24/2024  Therapeutic Exercise: -Supine bridges, 2 sets of 10 reps, 3 second holds, pt cued for max hip extension, on green exercise ball -Double knees to chest, 2 sets of 10 reps, on green theraball, pt cued for max pain free ROM and smooth motion -Thoracic and lumbar extensions in arm chair, 1 set of 5 reps, 10 second holds, pt cued for pain free ROM Neuromuscular Re-education: -Lower trunk rotations, 1 set of 10 reps, bilaterally, pt cued to remain in pain free ROM -Shoulder extensions on black foam, 2 set of 10 reps, GTB at chest level on web slide, pt cued for sequencing  -Side plank, 1 set of 2 reps of 30 second holds, bilaterally, pt cued for increased hip elevation and proper LE/UE placement -Bird dog, 2 set of 7 reps, bilaterally, pt cued for increased hip ROM and neutral spine throughout movement Therapeutic Activity: -Lateral stepping, 2 laps, 20 feet per lap, with BTB around ankles, pt cued for upright posture and slight bend in knees -Farmer carries with 5lb kettle bell, 1 minute each arm, pt cued for core activation and keeping weight close to chest    PATIENT EDUCATION:  Education details: Plan of care, prognosis, objective findings, HEP, and goals for physical therapy Person educated: Patient Education method: Explanation, Demonstration, and Handouts Education comprehension: verbalized understanding and returned demonstration  HOME EXERCISE PROGRAM: Access Code: [email or web address] URL: [email or web address] Date: 08/06/2024 Prepared by: Lacinda Fass Exercises - Supine Hip Adduction Isometric with Ball  - 1 x daily - 7 x weekly - 3 sets - 10 reps - 5 seconds hold - Seated Hip Adduction Isometrics with Ball  - 1 x daily - 7 x weekly - 3 sets - 10 reps - 5 seconds hold - Supine Bilateral Hamstring Sets  - 1 x daily - 7 x weekly - 3 sets - 10 reps  - 5 seconds hold - Seated Hamstring Set  - 1 x daily - 7 x weekly - 3 sets - 10 reps - 5 seconds hold  Access Code: [email or web address] URL: [email or web address] Date: 09/09/2024 Prepared by: Greig Fuse - Sit to Stand with Hands on Knees  - 2 x daily - 7 x weekly - 10 reps - Standing Shoulder Flexion Stretch on Wall  - 2 x daily - 7 x weekly - 10 reps - 10 sec hold - Standing Lumbar Extension with Counter  - 1 x daily - 7  x weekly - 10 reps  ASSESSMENT:  CLINICAL IMPRESSION: Pt declined heat today as she is hot natured. Pt appeared to be more forward bent when entered clinic today as last visit.  Began session with UE Flexion slide facing wall to work on lumbar extension.  Pt reported pain relief when completing this activity.   Sit to stands noted with visible LE tremors due to weakness.  Pt was able to complete these without UE assist.  Pt with questions regarding use of SPC; pt does not have one but thinking of acquiring one if helps.  Pt does have rolling walker with seat.  Pt used walker with ambulation with much improved posturing and no pain.  Pt really does not want to use RW due to bulkiness and inconvenience.  Pt trained on [facility] for cadence/sequencing.  Cane did assist on keeping pt more upright with ambulation and did good with sequencing.  Recommended pt acquire cane for ambulation.  Updated HEP to include added exercises today.  Pt required frequent redirection as heavy conversation during session, getting off track of therex.    Eval: Patient is a 73 y.o. female who was seen today for physical therapy evaluation and treatment for chronic low back pain.  She presented with low to moderate pain severity and irritability with bilateral hamstring strength testing and lumbar extension and rotation reproducing her familiar symptoms.  She exhibited significant deficits in lumbar active range of motion which result in her abnormal posture and gait deviations.  She is at an elevated fall  risk as evidenced by her timed up and go and 5 times sit to stand times in addition to her history of falling.  She was provided a HEP and she was able to properly demonstrate these interventions.  She reported feeling comfortable completing these interventions at home.  Recommend that she continue with skilled physical therapy to address her impairments to maximize her safety and functional mobility.  OBJECTIVE IMPAIRMENTS: Abnormal gait, decreased activity tolerance, decreased balance, decreased endurance, decreased mobility, difficulty walking, decreased ROM, decreased strength, hypomobility, postural dysfunction, and pain.   ACTIVITY LIMITATIONS: carrying, lifting, bending, standing, transfers, and locomotion level  PARTICIPATION LIMITATIONS: cleaning, laundry, shopping, and community activity  PERSONAL FACTORS: Past/current experiences, Time since onset of injury/illness/exacerbation, and 3+ comorbidities: Anxiety, OA, COPD, history of COVID-19, and history of depression   are also affecting patient's functional outcome.   REHAB POTENTIAL: Fair    CLINICAL DECISION MAKING: Evolving/moderate complexity  EVALUATION COMPLEXITY: Moderate   GOALS: Goals reviewed with patient? Yes  SHORT TERM GOALS: Target date: 08/27/24  Patient will be independent with her initial HEP. Baseline: Goal status: INITIAL  2.  Patient will improve her modified ODI to 15/50 or better for improved perceived function with her daily activities. Baseline:  Goal status: INITIAL  3.  Patient will be able to demonstrate improved lumbar extension to neutral for improved awareness of her surroundings. Baseline:  Goal status: INITIAL  4.  Patient will be able to complete her daily activities without her familiar pain exceeding 8/10. Baseline:  Goal status: INITIAL  LONG TERM GOALS: Target date: 09/17/24  Patient will be independent with her advanced HEP. Baseline:  Goal status: INITIAL  2.  Patient will  improve her timed up and go time to 12 seconds or less to reduce her risk of falling. Baseline:  Goal status: INITIAL  3.  Patient will improve her 2-minute walk test distance by at least 50 feet for improved community mobility. Baseline:  Goal status: INITIAL  4.  Patient will improve her 5 times sit to stand time to 12 seconds or less for improved lower extremity power. Baseline:  Goal status: INITIAL  5.  Patient will be able to complete her daily activities without her familiar pain exceeding 6/10. Baseline:  Goal status: INITIAL  PLAN:  PT FREQUENCY: 2x/week  PT DURATION: 6 weeks  PLANNED INTERVENTIONS: 97164- PT Re-evaluation, 97750- Physical Performance Testing, 97110-Therapeutic exercises, 97530- Therapeutic activity, 97112- Neuromuscular re-education, 97535- Self Care, 02859- Manual therapy, [phone]- Gait training, [phone]- Electrical stimulation (unattended), Patient/Family education, Balance training, Stair training, Taping, Joint mobilization, Spinal mobilization, Cryotherapy, and Moist heat.  PLAN FOR NEXT SESSION: Postural reeducation, lumbar and lower extremity strengthening, and balance interventions.  Follow up if using SPC   Nyrie Sigal B Vivian, PTA/CLT [facility] Ph: [phone] 2:46 PM, 09/09/2024

## 2024-09-14 ENCOUNTER — Ambulatory Visit (HOSPITAL_COMMUNITY)

## 2024-09-15 ENCOUNTER — Encounter (HOSPITAL_COMMUNITY): Payer: Self-pay

## 2024-09-15 ENCOUNTER — Ambulatory Visit (HOSPITAL_COMMUNITY)

## 2024-09-15 DIAGNOSIS — R2689 Other abnormalities of gait and mobility: Secondary | ICD-10-CM

## 2024-09-15 DIAGNOSIS — M5459 Other low back pain: Secondary | ICD-10-CM | POA: Diagnosis not present

## 2024-09-15 DIAGNOSIS — R293 Abnormal posture: Secondary | ICD-10-CM

## 2024-09-15 NOTE — Therapy (Signed)
 OUTPATIENT PHYSICAL THERAPY THORACOLUMBAR TREATMENT   Patient Name: Rebecca Hansen MRN: [id number] DOB:March 15, 1951, 73 y.o., female Today's Date: 09/15/2024  END OF SESSION:  PT End of Session - 09/15/24 1541     Visit Number 9    Number of Visits 12    Date for Recertification  10/29/24    Authorization Type [employer]    Authorization Time Period 12 approved, 08/06/24-09/17/24    Authorization - Visit Number 8    Authorization - Number of Visits 12    Progress Note Due on Visit 10    PT Start Time 1542    PT Stop Time 1628    PT Time Calculation (min) 46 min    Activity Tolerance Patient tolerated treatment well    Behavior During Therapy WFL for tasks assessed/performed               Past Medical History:  Diagnosis Date   Anxiety    Arthritis    COPD (chronic obstructive pulmonary disease) (HCC)    Depression    Past Surgical History:  Procedure Laterality Date   CATARACT EXTRACTION W/PHACO Left 03/02/2021   Procedure: CATARACT EXTRACTION PHACO AND INTRAOCULAR LENS PLACEMENT (IOC);  Surgeon: Harrie Agent, MD;  Location: [facility];  Service: Ophthalmology;  Laterality: Left;  CDE: 1136   CATARACT EXTRACTION W/PHACO Right 03/30/2021   Procedure: CATARACT EXTRACTION PHACO AND INTRAOCULAR LENS PLACEMENT (IOC);  Surgeon: Harrie Agent, MD;  Location: [facility];  Service: Ophthalmology;  Laterality: Right;  CDE: 13.98   HYSTEROSCOPY     Patient Active Problem List   Diagnosis Date Noted   Intractable vomiting 10/04/2019   Lobar pneumonia 10/03/2019   Thrombocytopenia 10/03/2019   Chronic pain syndrome 10/03/2019   Acute respiratory disease due to COVID-19 virus 10/02/2019   Pneumonia due to COVID-19 virus 10/02/2019   COPD (chronic obstructive pulmonary disease) (HCC) 10/02/2019   Acute on chronic respiratory failure with hypoxia due to Covid pneumonia 10/02/2019   Hyperglycemia, drug-induced 06/20/2015   Abnormal thyroid function test 06/20/2015   Tobacco abuse  06/19/2015   Back pain 06/19/2015   Anxiety 06/19/2015    PCP: Nemiah Kemps, MD  REFERRING PROVIDER: Hyacinth Honey, NP   REFERRING DIAG: back pain   Rationale for Evaluation and Treatment: Rehabilitation  THERAPY DIAG:  Other low back pain  Abnormal posture  Other abnormalities of gait and mobility  ONSET DATE: March-April 2025  SUBJECTIVE:  SUBJECTIVE STATEMENT: Pain scale 3/10 center of LBP, dull achy pain today.  Stated grandson plans to make her a cane at home.  Has walker at home   Eval: Patient reports that her back has been bothering her for about a year, but she initially injured her back years ago when she was in her 50's. She got a new mattress in late March or early April and she has been having pain since then. She has changed how she sleeps as she no longer sleeps with a pillow between her knees which she used for years. She likes to walk, but she cannot get anyone to walk with her and she does not want to walk alone. However, she laid in bed for about a month after her fiance died earlier this year. She fell off a ladder about 1.5 months ago. She ended up hitting her back on the chair behind her and her arm on the dresser.   PERTINENT HISTORY:  Anxiety, OA, COPD, history of COVID-19, and history of depression    PAIN:  Are you having pain? Yes: NPRS scale: Current: 10/10 Pain location: low back  Pain description: spasms, sharp, stabbing, constant Aggravating factors: bending over,   Relieving factors: medication    PRECAUTIONS: Fall  RED FLAGS: None   WEIGHT BEARING RESTRICTIONS: No  FALLS:  Has patient fallen in last 6 months? Yes. Number of falls 1  LIVING ENVIRONMENT: Lives with: lives with their daughter Lives in: House/apartment Stairs: Yes: Internal: 15 steps;  on left going up; step to pattern Has following equipment at home: shower chair  OCCUPATION: retired  PLOF: Independent  PATIENT GOALS: reduced pain, improved mobility, and be able to stand upright  NEXT MD VISIT: none scheduled, but patient is unsure  OBJECTIVE:  Note: Objective measures were completed at Evaluation unless otherwise noted.  PATIENT SURVEYS:  Modified Oswestry:  MODIFIED OSWESTRY DISABILITY SCALE  Date: 08/06/24 Score  Pain intensity 3 =  Pain medication provides me with moderate relief from pain.  2. Personal care (washing, dressing, etc.) 1 =  I can take care of myself normally, but it increases my pain.  3. Lifting 5 =  I cannot lift or carry anything at all.  4. Walking 3 =  Pain prevents me from walking more than  mile.  5. Sitting 2 =  Pain prevents me from sitting more than 1 hour.  6. Standing 2 =  Pain prevents me from standing more than 1 hour  7. Sleeping 2 =  Even when I take pain medication, I sleep less than 6 hours  8. Social Life 4 =  Pain has restricted my social life to my home  9. Traveling 2 =  My pain restricts my travel over 2 hours.  10. Employment/ Homemaking 2 = I can perform most of my homemaking/job duties, but pain prevents me from performing more physically stressful activities (eg, lifting, vacuuming).  Total 26/50   Interpretation of scores: Score Category Description  0-20% Minimal Disability The patient can cope with most living activities. Usually no treatment is indicated apart from advice on lifting, sitting and exercise  21-40% Moderate Disability The patient experiences more pain and difficulty with sitting, lifting and standing. Travel and social life are more difficult and they may be disabled from work. Personal care, sexual activity and sleeping are not grossly affected, and the patient can usually be managed by conservative means  41-60% Severe Disability Pain remains the main problem in this group, but  activities of daily  living are affected. These patients require a detailed investigation  61-80% Crippled Back pain impinges on all aspects of the patients life. Positive intervention is required  81-100% Bed-bound These patients are either bed-bound or exaggerating their symptoms  Bluford FORBES Zoe DELENA Karon DELENA, et al. Surgery versus conservative management of stable thoracolumbar fracture: the PRESTO feasibility RCT. [place] ([place]): [employer]; 2021 Nov. [facility] Technology Assessment, No. 25.62.) Appendix 3, Oswestry Disability Index category descriptors. Available from: Findjewelers.cz  Minimally Clinically Important Difference (MCID) = 12.8%  COGNITION: Overall cognitive status: Within functional limits for tasks assessed     SENSATION: Patient reports intermittent tingling in her back, but none currently.  POSTURE: rounded shoulders, forward head, decreased lumbar lordosis, increased thoracic kyphosis, and flexed trunk   LUMBAR ROM:   AROM eval  Flexion 50% limited  Extension Unable to achieve neutral due to pain   Right lateral flexion   Left lateral flexion   Right rotation 75% limited; familiar pain   Left rotation 75% limited; familiar pain    (Blank rows = not tested)  LOWER EXTREMITY ROM:  WFL for activities assessed  LOWER EXTREMITY MMT:    MMT Right eval Left eval  Hip flexion 3+/5; pulling 3+/5  Hip extension    Hip abduction    Hip adduction    Hip internal rotation    Hip external rotation    Knee flexion 4+/5; familiar pain  4+/5; familiar pain (not as bad as right side)  Knee extension 4+/5 5/5  Ankle dorsiflexion 4-/5 4-/5  Ankle plantarflexion    Ankle inversion    Ankle eversion     (Blank rows = not tested)  FUNCTIONAL TESTS:  5 times sit to stand: 25.43 seconds; intermittent use of armrests Timed up and go (TUG): 15.38 seconds 2 minute walk test: 285 feet; reported bilateral leg pain, but no back  pain  GAIT: Distance walked: 285 feet Assistive device utilized: None Level of assistance: Complete Independence Comments: flexed trunk with poor stride length and gait speed  TREATMENT DATE:                                                                                                                               09/15/24: SPC 167ft with min cueing for sequence Standing:  - Hip flexion stretch with foot on 12in 2x 60  -Mirror feedback for posture awareness Seated sit to stand with RTB around thighs to address valgus 10x Supine:  Decompression 2-5 5x 5 (Verbal and tactile cueing to improve cervical retraction  09/09/24 Pt denies heat as she is too hot Standing:  lumbar extension against counter  UE flexion against wall stretch 10X10 Walking with walker around clinic (pt much improved posture) Walking with SPC (per request rather than walker)  09/07/24 Supine on moist heat:  Abdominal bracing 10X5  SKTC 10X 10 holds each LE  Bridging 10X5  SLR 2X10 each LE  Standing: lumbar extension against counter 10X   09/02/2024  Manual Therapy: -CPA/UPA mobilizations of Lumbar segments L2-L5, grade III-IV -STM of Lumbar Paraspinal musculature, tightness noted bilaterally Therapeutic Exercise: -Straight leg raises, 2 sets 8 reps, pt cued for controlled movement -Supine bridges, 1 sets of 10 reps, 3 second holds, pt cued for max hip extension, RTB at knees -Side lying clamshells, 2 sets of 10 reps bilaterally, pt cued for eccentric control and   -Single knees to chest, 1 sets of 2 reps, 20 second holds, on green theraball, pt cued for max pain free ROM and smooth motion   08/31/2024  Manual Therapy: -CPA/UPA mobilizations of Lumbar segments L2-L5, grade II-III  -STM of Lumbar Paraspinal musculature Therapeutic Exercise: -Nustep, 5 minutes, level 4 resistance, pt cued for 70-80 spm -Side lying open book, 1 set of 10 reps bilaterally, pt cued for LE placement and  sequencing -Supine bridges, 1 sets of 10 reps, 3 second holds, pt cued for max hip extension, RTB at knees -Double knees to chest, 1 sets of 15 reps, on green theraball, pt cued for max pain free ROM and smooth motion Neuromuscular Re-education: -Core press downs, green theraball, straight and both sides, 1 set of 7 reps 5 second holds each direction, pt cued for max pain free lumbar ROM  08/24/2024  Therapeutic Exercise: -Supine bridges, 2 sets of 10 reps, 3 second holds, pt cued for max hip extension, on green exercise ball -Double knees to chest, 2 sets of 10 reps, on green theraball, pt cued for max pain free ROM and smooth motion -Thoracic and lumbar extensions in arm chair, 1 set of 5 reps, 10 second holds, pt cued for pain free ROM Neuromuscular Re-education: -Lower trunk rotations, 1 set of 10 reps, bilaterally, pt cued to remain in pain free ROM -Shoulder extensions on black foam, 2 set of 10 reps, GTB at chest level on web slide, pt cued for sequencing  -Side plank, 1 set of 2 reps of 30 second holds, bilaterally, pt cued for increased hip elevation and proper LE/UE placement -Bird dog, 2 set of 7 reps, bilaterally, pt cued for increased hip ROM and neutral spine throughout movement Therapeutic Activity: -Lateral stepping, 2 laps, 20 feet per lap, with BTB around ankles, pt cued for upright posture and slight bend in knees -Farmer carries with 5lb kettle bell, 1 minute each arm, pt cued for core activation and keeping weight close to chest    PATIENT EDUCATION:  Education details: Plan of care, prognosis, objective findings, HEP, and goals for physical therapy Person educated: Patient Education method: Explanation, Demonstration, and Handouts Education comprehension: verbalized understanding and returned demonstration  HOME EXERCISE PROGRAM: Access Code: [email or web address] URL: [email or web address] Date: 08/06/2024 Prepared by: Lacinda Fass Exercises - Supine Hip  Adduction Isometric with Ball  - 1 x daily - 7 x weekly - 3 sets - 10 reps - 5 seconds hold - Seated Hip Adduction Isometrics with Ball  - 1 x daily - 7 x weekly - 3 sets - 10 reps - 5 seconds hold - Supine Bilateral Hamstring Sets  - 1 x daily - 7 x weekly - 3 sets - 10 reps - 5 seconds hold - Seated Hamstring Set  - 1 x daily - 7 x weekly - 3 sets - 10 reps - 5 seconds hold  Access Code: [email or web address] URL: [email or web address] Date: 09/09/2024 Prepared by: Greig Fuse - Sit to Stand with Hands on Knees  - 2 x daily - 7 x weekly -  10 reps - Standing Shoulder Flexion Stretch on Wall  - 2 x daily - 7 x weekly - 10 reps - 10 sec hold - Standing Lumbar Extension with Counter  - 1 x daily - 7 x weekly - 10 reps  09/15/24: - Decompression 2-5   ASSESSMENT:  CLINICAL IMPRESSION: Pt presents with significant core and proximal strengthening.  Session focus on postural strengthening to reduce strain and pressure on lower back, to improve core and proximal strengthenig.  Pt presents with significant forward flexed posture that improves when using  AD.  Pt encouraged to use RW, pt stated her RW is bulkiness and inconvenience, her grandson plans to make her cane.  Min cueing required for sequence with cane, no LOB during gait training.  Added decompression exercises with some verbal and tactile cueing to improve cervical and scapular retraction.  Pt liked this exercise, stated back pain resolved following this exercise, pt given copy to add to HEP.    Eval: Patient is a 73 y.o. female who was seen today for physical therapy evaluation and treatment for chronic low back pain.  She presented with low to moderate pain severity and irritability with bilateral hamstring strength testing and lumbar extension and rotation reproducing her familiar symptoms.  She exhibited significant deficits in lumbar active range of motion which result in her abnormal posture and gait deviations.  She is at an elevated  fall risk as evidenced by her timed up and go and 5 times sit to stand times in addition to her history of falling.  She was provided a HEP and she was able to properly demonstrate these interventions.  She reported feeling comfortable completing these interventions at home.  Recommend that she continue with skilled physical therapy to address her impairments to maximize her safety and functional mobility.  OBJECTIVE IMPAIRMENTS: Abnormal gait, decreased activity tolerance, decreased balance, decreased endurance, decreased mobility, difficulty walking, decreased ROM, decreased strength, hypomobility, postural dysfunction, and pain.   ACTIVITY LIMITATIONS: carrying, lifting, bending, standing, transfers, and locomotion level  PARTICIPATION LIMITATIONS: cleaning, laundry, shopping, and community activity  PERSONAL FACTORS: Past/current experiences, Time since onset of injury/illness/exacerbation, and 3+ comorbidities: Anxiety, OA, COPD, history of COVID-19, and history of depression   are also affecting patient's functional outcome.   REHAB POTENTIAL: Fair    CLINICAL DECISION MAKING: Evolving/moderate complexity  EVALUATION COMPLEXITY: Moderate   GOALS: Goals reviewed with patient? Yes  SHORT TERM GOALS: Target date: 08/27/24  Patient will be independent with her initial HEP. Baseline: Goal status: INITIAL  2.  Patient will improve her modified ODI to 15/50 or better for improved perceived function with her daily activities. Baseline:  Goal status: INITIAL  3.  Patient will be able to demonstrate improved lumbar extension to neutral for improved awareness of her surroundings. Baseline:  Goal status: INITIAL  4.  Patient will be able to complete her daily activities without her familiar pain exceeding 8/10. Baseline:  Goal status: INITIAL  LONG TERM GOALS: Target date: 09/17/24  Patient will be independent with her advanced HEP. Baseline:  Goal status: INITIAL  2.  Patient  will improve her timed up and go time to 12 seconds or less to reduce her risk of falling. Baseline:  Goal status: INITIAL  3.  Patient will improve her 2-minute walk test distance by at least 50 feet for improved community mobility. Baseline:  Goal status: INITIAL  4.  Patient will improve her 5 times sit to stand time to 12 seconds or less  for improved lower extremity power. Baseline:  Goal status: INITIAL  5.  Patient will be able to complete her daily activities without her familiar pain exceeding 6/10. Baseline:  Goal status: INITIAL  PLAN:  PT FREQUENCY: 2x/week  PT DURATION: 6 weeks  PLANNED INTERVENTIONS: 97164- PT Re-evaluation, 97750- Physical Performance Testing, 97110-Therapeutic exercises, 97530- Therapeutic activity, 97112- Neuromuscular re-education, 97535- Self Care, [id number]- Manual therapy, [phone]- Gait training, [phone]- Electrical stimulation (unattended), Patient/Family education, Balance training, Stair training, Taping, Joint mobilization, Spinal mobilization, Cryotherapy, and Moist heat.  PLAN FOR NEXT SESSION: Postural reeducation, lumbar and lower extremity strengthening, and balance interventions.  Follow up if using SPC.  10th visit progress note.   Augustin Mclean, LPTA/CLT; WILLAIM [phone]  4:38 PM, 09/15/2024

## 2024-09-16 ENCOUNTER — Ambulatory Visit (HOSPITAL_COMMUNITY)

## 2024-09-16 ENCOUNTER — Encounter (HOSPITAL_COMMUNITY): Payer: Self-pay

## 2024-09-16 DIAGNOSIS — R293 Abnormal posture: Secondary | ICD-10-CM

## 2024-09-16 DIAGNOSIS — M5459 Other low back pain: Secondary | ICD-10-CM | POA: Diagnosis not present

## 2024-09-16 DIAGNOSIS — R2689 Other abnormalities of gait and mobility: Secondary | ICD-10-CM

## 2024-09-16 NOTE — Addendum Note (Signed)
 Addended by: ELSPETH LACINDA BROCKS on: 09/16/2024 06:02 PM   Modules accepted: Orders

## 2024-09-16 NOTE — Therapy (Addendum)
 OUTPATIENT PHYSICAL THERAPY THORACOLUMBAR TREATMENT Progress Note Reporting Period 08/06/24 to 09/16/24  See note below for Objective Data and Assessment of Progress/Goals.      Patient Name: Rebecca Hansen MRN: [id number] DOB:22-Jun-1951, 73 y.o., female Today's Date: 09/16/2024  END OF SESSION:  PT End of Session - 09/16/24 1419     Visit Number 10    Number of Visits 18    Date for Recertification  10/29/24    Authorization Type [employer] Medicare    Authorization Time Period 12 approved, 08/06/24-09/17/24    Authorization - Visit Number 10    Authorization - Number of Visits 12    Progress Note Due on Visit 20    PT Start Time 1420    PT Stop Time 1505    PT Time Calculation (min) 45 min    Activity Tolerance Patient tolerated treatment well    Behavior During Therapy WFL for tasks assessed/performed               Past Medical History:  Diagnosis Date   Anxiety    Arthritis    COPD (chronic obstructive pulmonary disease) (HCC)    Depression    Past Surgical History:  Procedure Laterality Date   CATARACT EXTRACTION W/PHACO Left 03/02/2021   Procedure: CATARACT EXTRACTION PHACO AND INTRAOCULAR LENS PLACEMENT (IOC);  Surgeon: Harrie Agent, MD;  Location: [facility];  Service: Ophthalmology;  Laterality: Left;  CDE: 1136   CATARACT EXTRACTION W/PHACO Right 03/30/2021   Procedure: CATARACT EXTRACTION PHACO AND INTRAOCULAR LENS PLACEMENT (IOC);  Surgeon: Harrie Agent, MD;  Location: [facility];  Service: Ophthalmology;  Laterality: Right;  CDE: 13.98   HYSTEROSCOPY     Patient Active Problem List   Diagnosis Date Noted   Intractable vomiting 10/04/2019   Lobar pneumonia 10/03/2019   Thrombocytopenia 10/03/2019   Chronic pain syndrome 10/03/2019   Acute respiratory disease due to COVID-19 virus 10/02/2019   Pneumonia due to COVID-19 virus 10/02/2019   COPD (chronic obstructive pulmonary disease) (HCC) 10/02/2019   Acute on chronic respiratory failure with hypoxia due to  Covid pneumonia 10/02/2019   Hyperglycemia, drug-induced 06/20/2015   Abnormal thyroid function test 06/20/2015   Tobacco abuse 06/19/2015   Back pain 06/19/2015   Anxiety 06/19/2015    PCP: Nemiah Kemps, MD  REFERRING PROVIDER: Hyacinth Honey, NP   REFERRING DIAG: back pain   Rationale for Evaluation and Treatment: Rehabilitation  THERAPY DIAG:  Other low back pain  Abnormal posture  Other abnormalities of gait and mobility  ONSET DATE: March-April 2025  SUBJECTIVE:  SUBJECTIVE STATEMENT: No reports of pain today, yesterday's session felt good.  Has been more aware of her posture.  Feels she has improved with therapy since beginning  Eval: Patient reports that her back has been bothering her for about a year, but she initially injured her back years ago when she was in her 27's. She got a new mattress in late March or early April and she has been having pain since then. She has changed how she sleeps as she no longer sleeps with a pillow between her knees which she used for years. She likes to walk, but she cannot get anyone to walk with her and she does not want to walk alone. However, she laid in bed for about a month after her fiance died earlier this year. She fell off a ladder about 1.5 months ago. She ended up hitting her back on the chair behind her and her arm on the dresser.   PERTINENT HISTORY:  Anxiety, OA, COPD, history of COVID-19, and history of depression    PAIN:  Are you having pain? Yes: NPRS scale: Current: 10/10 Pain location: low back  Pain description: spasms, sharp, stabbing, constant Aggravating factors: bending over,   Relieving factors: medication    PRECAUTIONS: Fall  RED FLAGS: None   WEIGHT BEARING RESTRICTIONS: No  FALLS:  Has patient fallen in last 6  months? Yes. Number of falls 1  LIVING ENVIRONMENT: Lives with: lives with their daughter Lives in: House/apartment Stairs: Yes: Internal: 15 steps; on left going up; step to pattern Has following equipment at home: shower chair  OCCUPATION: retired  PLOF: Independent  PATIENT GOALS: reduced pain, improved mobility, and be able to stand upright  NEXT MD VISIT: none scheduled, but patient is unsure  OBJECTIVE:  Note: Objective measures were completed at Evaluation unless otherwise noted.  PATIENT SURVEYS:  Modified Oswestry:  MODIFIED OSWESTRY DISABILITY SCALE   Date: 08/06/24 Score 09/16/24  Pain intensity 3 =  Pain medication provides me with moderate relief from pain. 2 =  Pain medication provides me with complete relief from pain.  2. Personal care (washing, dressing, etc.) 1 =  I can take care of myself normally, but it increases my pain. 1 =  I can take care of myself normally, but it increases my pain.  3. Lifting 5 =  I cannot lift or carry anything at all. 2 = Pain prevents me from lifting heavy weights off the floor,but I can manage if the weights are conveniently positioned (e.g. on a table)  4. Walking 3 =  Pain prevents me from walking more than  mile. 3 =  Pain prevents me from walking more than  mile.  5. Sitting 2 =  Pain prevents me from sitting more than 1 hour. 0 =  I can sit in any chair as long as I like.  6. Standing 2 =  Pain prevents me from standing more than 1 hour 2 =  Pain prevents me from standing more than 1 hour  7. Sleeping 2 =  Even when I take pain medication, I sleep less than 6 hours 2 =  Even when I take pain medication, I sleep less than 6 hours  8. Social Life 4 =  Pain has restricted my social life to my home 2 = Pain prevents me from participating in more energetic activities (eg. sports, dancing).  9. Traveling 2 =  My pain restricts my travel over 2 hours. 1 =  I can travel  anywhere, but it increases my pain.  10. Employment/ Homemaking 2 = I  can perform most of my homemaking/job duties, but pain prevents me from performing more physically stressful activities (eg, lifting, vacuuming). 2 = I can perform most of my homemaking/job duties, but pain prevents me from performing more physically stressful activities (eg, lifting, vacuuming).  Total 26/50 17/50     Interpretation of scores: Score Category Description  0-20% Minimal Disability The patient can cope with most living activities. Usually no treatment is indicated apart from advice on lifting, sitting and exercise  21-40% Moderate Disability The patient experiences more pain and difficulty with sitting, lifting and standing. Travel and social life are more difficult and they may be disabled from work. Personal care, sexual activity and sleeping are not grossly affected, and the patient can usually be managed by conservative means  41-60% Severe Disability Pain remains the main problem in this group, but activities of daily living are affected. These patients require a detailed investigation  61-80% Crippled Back pain impinges on all aspects of the patients life. Positive intervention is required  81-100% Bed-bound These patients are either bed-bound or exaggerating their symptoms  Bluford FORBES Zoe DELENA Karon DELENA, et al. Surgery versus conservative management of stable thoracolumbar fracture: the PRESTO feasibility RCT. [place] ([place]): [employer]; 2021 Nov. [facility] Technology Assessment, No. 25.62.) Appendix 3, Oswestry Disability Index category descriptors. Available from: Findjewelers.cz  Minimally Clinically Important Difference (MCID) = 12.8%  COGNITION: Overall cognitive status: Within functional limits for tasks assessed     SENSATION: Patient reports intermittent tingling in her back, but none currently.  POSTURE: rounded shoulders, forward head, decreased lumbar lordosis, increased thoracic kyphosis, and flexed trunk   LUMBAR  ROM:   AROM eval 09/16/24  Flexion 50% limited Toes toes, reports hamstring pulling  Extension Unable to achieve neutral due to pain  Improvements though unable to achieve neutral  Right lateral flexion    Left lateral flexion    Right rotation 75% limited; familiar pain    Left rotation 75% limited; familiar pain     (Blank rows = not tested)  LOWER EXTREMITY ROM:  WFL for activities assessed  LOWER EXTREMITY MMT:    MMT Right eval Left eval Left 09/16/24 Right 09/16/24  Hip flexion 3+/5; pulling 3+/5 4+ 4+  Hip extension   Prone with 2 pillows under hip 2+ * Prone with 2 pillows under hip2+*  Hip abduction    4+  Hip adduction      Hip internal rotation      Hip external rotation      Knee flexion 4+/5; familiar pain  4+/5; familiar pain (not as bad as right side) 4/5 * back pain not as bad as Right 4 * pain in back  Knee extension 4+/5 5/5    Ankle dorsiflexion 4-/5 4-/5 4+ 5  Ankle plantarflexion      Ankle inversion      Ankle eversion       (Blank rows = not tested)  FUNCTIONAL TESTS:  5 times sit to stand: 25.43 seconds; intermittent use of armrests Timed up and go (TUG): 15.38 seconds 2 minute walk test: 285 feet; reported bilateral leg pain, but no back pain  09/17/24:  with RW to assist with posture, 284ft , increased LE fatigue   5STS 13.29 no HHA  GAIT: Distance walked: 285 feet Assistive device utilized: None Level of assistance: Complete Independence Comments: flexed trunk with poor stride length and gait speed  TREATMENT DATE:                                                                                                                               09/17/24: with RW to assist with posture, 2110ft  MMT see above ODI 17/50 ROM see above 5STS no HHA 22.16, 13.29   09/15/24: SPC 177ft with min cueing for sequence Standing:  - Hip flexion stretch with foot on 12in 2x 60  -Mirror feedback for posture awareness Seated sit to  stand with RTB around thighs to address valgus 10x Supine:  Decompression 2-5 5x 5 (Verbal and tactile cueing to improve cervical retraction  09/09/24 Pt denies heat as she is too hot Standing:  lumbar extension against counter  UE flexion against wall stretch 10X10 Walking with walker around clinic (pt much improved posture) Walking with SPC (per request rather than walker)  09/07/24 Supine on moist heat:  Abdominal bracing 10X5  SKTC 10X 10 holds each LE  Bridging 10X5  SLR 2X10 each LE Standing: lumbar extension against counter 10X   09/02/2024  Manual Therapy: -CPA/UPA mobilizations of Lumbar segments L2-L5, grade III-IV -STM of Lumbar Paraspinal musculature, tightness noted bilaterally Therapeutic Exercise: -Straight leg raises, 2 sets 8 reps, pt cued for controlled movement -Supine bridges, 1 sets of 10 reps, 3 second holds, pt cued for max hip extension, RTB at knees -Side lying clamshells, 2 sets of 10 reps bilaterally, pt cued for eccentric control and   -Single knees to chest, 1 sets of 2 reps, 20 second holds, on green theraball, pt cued for max pain free ROM and smooth motion   08/31/2024  Manual Therapy: -CPA/UPA mobilizations of Lumbar segments L2-L5, grade II-III  -STM of Lumbar Paraspinal musculature Therapeutic Exercise: -Nustep, 5 minutes, level 4 resistance, pt cued for 70-80 spm -Side lying open book, 1 set of 10 reps bilaterally, pt cued for LE placement and sequencing -Supine bridges, 1 sets of 10 reps, 3 second holds, pt cued for max hip extension, RTB at knees -Double knees to chest, 1 sets of 15 reps, on green theraball, pt cued for max pain free ROM and smooth motion Neuromuscular Re-education: -Core press downs, green theraball, straight and both sides, 1 set of 7 reps 5 second holds each direction, pt cued for max pain free lumbar ROM  08/24/2024  Therapeutic Exercise: -Supine bridges, 2 sets of 10 reps, 3 second holds, pt cued for max hip  extension, on green exercise ball -Double knees to chest, 2 sets of 10 reps, on green theraball, pt cued for max pain free ROM and smooth motion -Thoracic and lumbar extensions in arm chair, 1 set of 5 reps, 10 second holds, pt cued for pain free ROM Neuromuscular Re-education: -Lower trunk rotations, 1 set of 10 reps, bilaterally, pt cued to remain in pain free ROM -Shoulder extensions on black foam, 2 set of 10 reps, GTB at chest level on web slide,  pt cued for sequencing  -Side plank, 1 set of 2 reps of 30 second holds, bilaterally, pt cued for increased hip elevation and proper LE/UE placement -Bird dog, 2 set of 7 reps, bilaterally, pt cued for increased hip ROM and neutral spine throughout movement Therapeutic Activity: -Lateral stepping, 2 laps, 20 feet per lap, with BTB around ankles, pt cued for upright posture and slight bend in knees -Farmer carries with 5lb kettle bell, 1 minute each arm, pt cued for core activation and keeping weight close to chest    PATIENT EDUCATION:  Education details: Plan of care, prognosis, objective findings, HEP, and goals for physical therapy Person educated: Patient Education method: Explanation, Demonstration, and Handouts Education comprehension: verbalized understanding and returned demonstration  HOME EXERCISE PROGRAM: Access Code: [email or web address] URL: [email or web address] Date: 08/06/2024 Prepared by: Lacinda Fass Exercises - Supine Hip Adduction Isometric with Ball  - 1 x daily - 7 x weekly - 3 sets - 10 reps - 5 seconds hold - Seated Hip Adduction Isometrics with Ball  - 1 x daily - 7 x weekly - 3 sets - 10 reps - 5 seconds hold - Supine Bilateral Hamstring Sets  - 1 x daily - 7 x weekly - 3 sets - 10 reps - 5 seconds hold - Seated Hamstring Set  - 1 x daily - 7 x weekly - 3 sets - 10 reps - 5 seconds hold  Access Code: [email or web address] URL: [email or web address] Date: 09/09/2024 Prepared by: Greig Fuse - Sit to Stand  with Hands on Knees  - 2 x daily - 7 x weekly - 10 reps - Standing Shoulder Flexion Stretch on Wall  - 2 x daily - 7 x weekly - 10 reps - 10 sec hold - Standing Lumbar Extension with Counter  - 1 x daily - 7 x weekly - 10 reps  09/15/24: - Decompression 2-5   ASSESSMENT:  CLINICAL IMPRESSION: Reviewed goals with the following findings: Pt has not met any goal in full today but is progressing well.  Reports compliance with HEP 2x/week.  Pain scale ranges from 3-10/10 dependening on ADL activitiy and time of day, reports increased pain in the morning and improved through day with pain medication.  ROM has improved, still unable to achieve neutral or any extension.  Strength is progressing well with hip flexors, continues to demonstrate significant weakness in gluteal mm.  Pt will benefits from skilled intervention to address goals unmet.  Encouraged increased compliance with HEP and to begin walking program with RW with verbalized understanding.    09/16/24 PROGRESS NOTE:  Patient is making fair progress with skilled physical therapy as evidenced by her subjective reports and functional mobility. She has yet to meet her short or long term goals for physical therapy at this time. However, she reports subjective improvements and she has been able to demonstrate improved AROM and muscular strength. Recommend that she continue with skilled physical therapy for eight additional visits at twice per week for a total of 18 visits to address her remaining impairments to maximize her safety and functional mobility. SABRA Lacinda Fass, PT, DPT    Eval: Patient is a 73 y.o. female who was seen today for physical therapy evaluation and treatment for chronic low back pain.  She presented with low to moderate pain severity and irritability with bilateral hamstring strength testing and lumbar extension and rotation reproducing her familiar symptoms.  She exhibited significant deficits in lumbar active range of motion  which result in her  abnormal posture and gait deviations.  She is at an elevated fall risk as evidenced by her timed up and go and 5 times sit to stand times in addition to her history of falling.  She was provided a HEP and she was able to properly demonstrate these interventions.  She reported feeling comfortable completing these interventions at home.  Recommend that she continue with skilled physical therapy to address her impairments to maximize her safety and functional mobility.  OBJECTIVE IMPAIRMENTS: Abnormal gait, decreased activity tolerance, decreased balance, decreased endurance, decreased mobility, difficulty walking, decreased ROM, decreased strength, hypomobility, postural dysfunction, and pain.   ACTIVITY LIMITATIONS: carrying, lifting, bending, standing, transfers, and locomotion level  PARTICIPATION LIMITATIONS: cleaning, laundry, shopping, and community activity  PERSONAL FACTORS: Past/current experiences, Time since onset of injury/illness/exacerbation, and 3+ comorbidities: Anxiety, OA, COPD, history of COVID-19, and history of depression   are also affecting patient's functional outcome.   REHAB POTENTIAL: Fair    CLINICAL DECISION MAKING: Evolving/moderate complexity  EVALUATION COMPLEXITY: Moderate   GOALS: Goals reviewed with patient? Yes  SHORT TERM GOALS: Target date: 08/27/24  Patient will be independent with her initial HEP. Baseline:  09/17/24:  HEP compliance 2 days/ week Goal status: Inprogress  2.  Patient will improve her modified ODI to 15/50 or better for improved perceived function with her daily activities. Baseline: 09/16/24: 17/50 Goal status: Inprogress  3.  Patient will be able to demonstrate improved lumbar extension to neutral for improved awareness of her surroundings. Baseline: 09/16/24:  progressing Goal status: Inprogress  4.  Patient will be able to complete her daily activities without her familiar pain exceeding 8/10. Baseline:  09/16/24:  Reports increased pain in the morning, dependent upon pain medication for pain control, average pain scale 3-10/10 Goal status: Inprogress  LONG TERM GOALS: Target date: 09/17/24  Patient will be independent with her advanced HEP. Baseline:  Goal status: INITIAL  2.  Patient will improve her timed up and go time to 12 seconds or less to reduce her risk of falling. Baseline:  Goal status: INITIAL  3.  Patient will improve her 2-minute walk test distance by at least 50 feet for improved community mobility. Baseline: 09/16/24:  Improved by 12 feet Goal status: Inprogress  4.  Patient will improve her 5 times sit to stand time to 12 seconds or less for improved lower extremity power. Baseline: 09/16/24: 5 STS no HHA 13.29 Goal status: Inprogress  5.  Patient will be able to complete her daily activities without her familiar pain exceeding 6/10. Baseline:  Goal status: INITIAL  PLAN:  PT FREQUENCY: 2x/week  PT DURATION: 6 weeks  PLANNED INTERVENTIONS: 97164- PT Re-evaluation, 97750- Physical Performance Testing, 97110-Therapeutic exercises, 97530- Therapeutic activity, 97112- Neuromuscular re-education, 97535- Self Care, 02859- Manual therapy, [phone]- Gait training, [phone]- Electrical stimulation (unattended), Patient/Family education, Balance training, Stair training, Taping, Joint mobilization, Spinal mobilization, Cryotherapy, and Moist heat.  PLAN FOR NEXT SESSION: Postural reeducation, lumbar and lower extremity strengthening, and balance interventions.  Follow up if using SPC.  Complete TUG and Thomas stretches next session.  Promote hip extension and postural strengthening.  Add bridges to HEP next session as well.  Plan to continues 2x/week for 4 more weeks   Augustin Mclean, LPTA/CLT; [facility] [phone]  5:20 PM, 09/16/2024  [facility] Medicare Auth Request Information Treatment Start Date: 09/21/24  Date of referral: 07/07/24 Referring provider: Hyacinth Honey, NP   Referring diagnosis (ICD 10)? M54.50  Treatment diagnosis (ICD 10)? (if different than referring diagnosis)  M54.59, R29.3, R26.89   What was this (referring dx) caused by? Ongoing Issue  Lysle of Condition: Chronic (continuous duration > 3 months)   Laterality: Both  Current Functional Measure Score: Back Index 17/50  Objective measurements identify impairments when they are compared to normal values, the uninvolved extremity, and prior level of function.  [x]  Yes  []  No  Objective assessment of functional ability: Severe functional limitations   Briefly describe symptoms: constant sharp, stabbing low back pain   How did symptoms start: lifting injury in my 30's   Average pain intensity:  Last 24 hours: 4/10  Past week: 6/10  How often does the pt experience symptoms? Constantly  How much have the symptoms interfered with usual daily activities? Quite a bit  How has condition changed since care began at this facility? A little better  In general, how is the patients overall health? Fair   BACK PAIN (STarT Back Screening Tool) No

## 2024-09-21 ENCOUNTER — Ambulatory Visit (HOSPITAL_COMMUNITY)

## 2024-09-21 ENCOUNTER — Encounter (HOSPITAL_COMMUNITY): Payer: Self-pay

## 2024-09-21 DIAGNOSIS — R293 Abnormal posture: Secondary | ICD-10-CM

## 2024-09-21 DIAGNOSIS — R2689 Other abnormalities of gait and mobility: Secondary | ICD-10-CM

## 2024-09-21 DIAGNOSIS — M5459 Other low back pain: Secondary | ICD-10-CM | POA: Diagnosis not present

## 2024-09-21 NOTE — Therapy (Signed)
 " OUTPATIENT PHYSICAL THERAPY THORACOLUMBAR TREATMENT   Patient Name: Rebecca Hansen MRN: [id number] DOB:03-21-1951, 73 y.o., female Today's Date: 09/21/2024  END OF SESSION:  PT End of Session - 09/21/24 1235     Visit Number 11    Number of Visits 18    Date for Recertification  10/29/24    Authorization Type [employer]    Authorization Time Period 12 approved, 08/06/24-09/17/24    Authorization - Visit Number 11    Authorization - Number of Visits 12    Progress Note Due on Visit 20    PT Start Time 1235    PT Stop Time 1319    PT Time Calculation (min) 44 min    Activity Tolerance Patient tolerated treatment well    Behavior During Therapy WFL for tasks assessed/performed                Past Medical History:  Diagnosis Date   Anxiety    Arthritis    COPD (chronic obstructive pulmonary disease) (HCC)    Depression    Past Surgical History:  Procedure Laterality Date   CATARACT EXTRACTION W/PHACO Left 03/02/2021   Procedure: CATARACT EXTRACTION PHACO AND INTRAOCULAR LENS PLACEMENT (IOC);  Surgeon: Harrie Agent, MD;  Location: [facility];  Service: Ophthalmology;  Laterality: Left;  CDE: 1136   CATARACT EXTRACTION W/PHACO Right 03/30/2021   Procedure: CATARACT EXTRACTION PHACO AND INTRAOCULAR LENS PLACEMENT (IOC);  Surgeon: Harrie Agent, MD;  Location: [facility];  Service: Ophthalmology;  Laterality: Right;  CDE: 13.98   HYSTEROSCOPY     Patient Active Problem List   Diagnosis Date Noted   Intractable vomiting 10/04/2019   Lobar pneumonia 10/03/2019   Thrombocytopenia 10/03/2019   Chronic pain syndrome 10/03/2019   Acute respiratory disease due to COVID-19 virus 10/02/2019   Pneumonia due to COVID-19 virus 10/02/2019   COPD (chronic obstructive pulmonary disease) (HCC) 10/02/2019   Acute on chronic respiratory failure with hypoxia due to Covid pneumonia 10/02/2019   Hyperglycemia, drug-induced 06/20/2015   Abnormal thyroid function test 06/20/2015   Tobacco  abuse 06/19/2015   Back pain 06/19/2015   Anxiety 06/19/2015    PCP: Nemiah Kemps, MD  REFERRING PROVIDER: Hyacinth Honey, NP   REFERRING DIAG: back pain   Rationale for Evaluation and Treatment: Rehabilitation  THERAPY DIAG:  Other low back pain  Abnormal posture  Other abnormalities of gait and mobility  ONSET DATE: March-April 2025  SUBJECTIVE:  SUBJECTIVE STATEMENT: Patient reports that she felt good after her last appointment, but she started exercising more at home and she is hurting more today.   Eval: Patient reports that her back has been bothering her for about a year, but she initially injured her back years ago when she was in her 89's. She got a new mattress in late March or early April and she has been having pain since then. She has changed how she sleeps as she no longer sleeps with a pillow between her knees which she used for years. She likes to walk, but she cannot get anyone to walk with her and she does not want to walk alone. However, she laid in bed for about a month after her fiance died earlier this year. She fell off a ladder about 1.5 months ago. She ended up hitting her back on the chair behind her and her arm on the dresser.   PERTINENT HISTORY:  Anxiety, OA, COPD, history of COVID-19, and history of depression    PAIN:  Are you having pain? Yes: NPRS scale: Current: 4/10 Pain location: low back  Pain description: spasms, sharp, stabbing, constant Aggravating factors: bending over,   Relieving factors: medication    PRECAUTIONS: Fall  RED FLAGS: None   WEIGHT BEARING RESTRICTIONS: No  FALLS:  Has patient fallen in last 6 months? Yes. Number of falls 1  LIVING ENVIRONMENT: Lives with: lives with their daughter Lives in: House/apartment Stairs: Yes:  Internal: 15 steps; on left going up; step to pattern Has following equipment at home: shower chair  OCCUPATION: retired  PLOF: Independent  PATIENT GOALS: reduced pain, improved mobility, and be able to stand upright  NEXT MD VISIT: none scheduled, but patient is unsure  OBJECTIVE:  Note: Objective measures were completed at Evaluation unless otherwise noted.  PATIENT SURVEYS:  Modified Oswestry:  MODIFIED OSWESTRY DISABILITY SCALE   Date: 08/06/24 Score 09/16/24  Pain intensity 3 =  Pain medication provides me with moderate relief from pain. 2 =  Pain medication provides me with complete relief from pain.  2. Personal care (washing, dressing, etc.) 1 =  I can take care of myself normally, but it increases my pain. 1 =  I can take care of myself normally, but it increases my pain.  3. Lifting 5 =  I cannot lift or carry anything at all. 2 = Pain prevents me from lifting heavy weights off the floor,but I can manage if the weights are conveniently positioned (e.g. on a table)  4. Walking 3 =  Pain prevents me from walking more than  mile. 3 =  Pain prevents me from walking more than  mile.  5. Sitting 2 =  Pain prevents me from sitting more than 1 hour. 0 =  I can sit in any chair as long as I like.  6. Standing 2 =  Pain prevents me from standing more than 1 hour 2 =  Pain prevents me from standing more than 1 hour  7. Sleeping 2 =  Even when I take pain medication, I sleep less than 6 hours 2 =  Even when I take pain medication, I sleep less than 6 hours  8. Social Life 4 =  Pain has restricted my social life to my home 2 = Pain prevents me from participating in more energetic activities (eg. sports, dancing).  9. Traveling 2 =  My pain restricts my travel over 2 hours. 1 =  I can travel anywhere, but  it increases my pain.  10. Employment/ Homemaking 2 = I can perform most of my homemaking/job duties, but pain prevents me from performing more physically stressful activities (eg, lifting,  vacuuming). 2 = I can perform most of my homemaking/job duties, but pain prevents me from performing more physically stressful activities (eg, lifting, vacuuming).  Total 26/50 17/50     Interpretation of scores: Score Category Description  0-20% Minimal Disability The patient can cope with most living activities. Usually no treatment is indicated apart from advice on lifting, sitting and exercise  21-40% Moderate Disability The patient experiences more pain and difficulty with sitting, lifting and standing. Travel and social life are more difficult and they may be disabled from work. Personal care, sexual activity and sleeping are not grossly affected, and the patient can usually be managed by conservative means  41-60% Severe Disability Pain remains the main problem in this group, but activities of daily living are affected. These patients require a detailed investigation  61-80% Crippled Back pain impinges on all aspects of the patients life. Positive intervention is required  81-100% Bed-bound These patients are either bed-bound or exaggerating their symptoms  Bluford FORBES Zoe DELENA Karon DELENA, et al. Surgery versus conservative management of stable thoracolumbar fracture: the PRESTO feasibility RCT. [place] ([place]): [employer]; 2021 Nov. [facility] Technology Assessment, No. 25.62.) Appendix 3, Oswestry Disability Index category descriptors. Available from: Findjewelers.cz  Minimally Clinically Important Difference (MCID) = 12.8%  COGNITION: Overall cognitive status: Within functional limits for tasks assessed     SENSATION: Patient reports intermittent tingling in her back, but none currently.  POSTURE: rounded shoulders, forward head, decreased lumbar lordosis, increased thoracic kyphosis, and flexed trunk   LUMBAR ROM:   AROM eval 09/16/24  Flexion 50% limited Toes toes, reports hamstring pulling  Extension Unable to achieve neutral due to pain   Improvements though unable to achieve neutral  Right lateral flexion    Left lateral flexion    Right rotation 75% limited; familiar pain    Left rotation 75% limited; familiar pain     (Blank rows = not tested)  LOWER EXTREMITY ROM:  WFL for activities assessed  LOWER EXTREMITY MMT:    MMT Right eval Left eval Left 09/16/24 Right 09/16/24  Hip flexion 3+/5; pulling 3+/5 4+ 4+  Hip extension   Prone with 2 pillows under hip 2+ * Prone with 2 pillows under hip2+*  Hip abduction    4+  Hip adduction      Hip internal rotation      Hip external rotation      Knee flexion 4+/5; familiar pain  4+/5; familiar pain (not as bad as right side) 4/5 * back pain not as bad as Right 4 * pain in back  Knee extension 4+/5 5/5    Ankle dorsiflexion 4-/5 4-/5 4+ 5  Ankle plantarflexion      Ankle inversion      Ankle eversion       (Blank rows = not tested)  FUNCTIONAL TESTS:  5 times sit to stand: 25.43 seconds; intermittent use of armrests Timed up and go (TUG): 15.38 seconds 2 minute walk test: 285 feet; reported bilateral leg pain, but no back pain  09/17/24:  with RW to assist with posture, 296ft , increased LE fatigue   5STS 13.29 no HHA  GAIT: Distance walked: 285 feet Assistive device utilized: None Level of assistance: Complete Independence Comments: flexed trunk with poor stride length and gait speed  TREATMENT  DATE:                                                                                                                                                                 09/21/24 EXERCISE LOG  Exercise Repetitions and Resistance Comments  Nustep  L4 x 5.5 minutes    Isometric ball press   20 reps w/ 3 second hold    Seated hip ADD isometric  2 minutes w/ 5 second hold    Standing ball roll out  2 minutes  For lumbar flexion   Standing lumbar extension   30 reps  To neutral; for proper body mechanics   Seated scapular retraction  20 reps    Rocker board  3  minutes  AP; with BUE support from parallel bars  Sit to stand 10 reps    Patient education  See below     Blank cell = exercise not performed today   09/17/24: with RW to assist with posture, 26ft  MMT see above ODI 17/50 ROM see above 5STS no HHA 22.16, 13.29   09/15/24: SPC 160ft with min cueing for sequence Standing:  - Hip flexion stretch with foot on 12in 2x 60  -Mirror feedback for posture awareness Seated sit to stand with RTB around thighs to address valgus 10x Supine:  Decompression 2-5 5x 5 (Verbal and tactile cueing to improve cervical retraction  09/09/24 Pt denies heat as she is too hot Standing:  lumbar extension against counter  UE flexion against wall stretch 10X10 Walking with walker around clinic (pt much improved posture) Walking with SPC (per request rather than walker)  09/07/24 Supine on moist heat:  Abdominal bracing 10X5  SKTC 10X 10 holds each LE  Bridging 10X5  SLR 2X10 each LE Standing: lumbar extension against counter 10X   09/02/2024  Manual Therapy: -CPA/UPA mobilizations of Lumbar segments L2-L5, grade III-IV -STM of Lumbar Paraspinal musculature, tightness noted bilaterally Therapeutic Exercise: -Straight leg raises, 2 sets 8 reps, pt cued for controlled movement -Supine bridges, 1 sets of 10 reps, 3 second holds, pt cued for max hip extension, RTB at knees -Side lying clamshells, 2 sets of 10 reps bilaterally, pt cued for eccentric control and   -Single knees to chest, 1 sets of 2 reps, 20 second holds, on green theraball, pt cued for max pain free ROM and smooth motion  PATIENT EDUCATION:  Education details: benefits of exercise, goals for physical therapy, pros and cons of a back brace, and plan of care Person educated: Patient Education method: Explanation Education comprehension: verbalized understanding  HOME EXERCISE PROGRAM: Access Code: [email or web address] URL: [email or web address] Date:  08/06/2024 Prepared by: Lacinda Fass Exercises - Supine Hip Adduction Isometric with Ball  - 1 x daily - 7 x weekly -  3 sets - 10 reps - 5 seconds hold - Seated Hip Adduction Isometrics with Ball  - 1 x daily - 7 x weekly - 3 sets - 10 reps - 5 seconds hold - Supine Bilateral Hamstring Sets  - 1 x daily - 7 x weekly - 3 sets - 10 reps - 5 seconds hold - Seated Hamstring Set  - 1 x daily - 7 x weekly - 3 sets - 10 reps - 5 seconds hold  Access Code: [email or web address] URL: [email or web address] Date: 09/09/2024 Prepared by: Greig Fuse - Sit to Stand with Hands on Knees  - 2 x daily - 7 x weekly - 10 reps - Standing Shoulder Flexion Stretch on Wall  - 2 x daily - 7 x weekly - 10 reps - 10 sec hold - Standing Lumbar Extension with Counter  - 1 x daily - 7 x weekly - 10 reps  09/15/24: - Decompression 2-5   ASSESSMENT:  CLINICAL IMPRESSION: Patient was progressed with new and familiar interventions for postural reeducation. She required minimal cueing with today's new interventions for proper muscular engagement to promote upright stance. She experienced a mild increase in her familiar pain with lumbar extension, but this did not limit her ability to complete any of today's interventions. She reported feeling better upon the conclusion of treatment. Patient continues to require skilled physical therapy to address her remaining impairments to return to her prior level of function.    Eval: Patient is a 73 y.o. female who was seen today for physical therapy evaluation and treatment for chronic low back pain.  She presented with low to moderate pain severity and irritability with bilateral hamstring strength testing and lumbar extension and rotation reproducing her familiar symptoms.  She exhibited significant deficits in lumbar active range of motion which result in her abnormal posture and gait deviations.  She is at an elevated fall risk as evidenced by her timed up and go and 5 times sit  to stand times in addition to her history of falling.  She was provided a HEP and she was able to properly demonstrate these interventions.  She reported feeling comfortable completing these interventions at home.  Recommend that she continue with skilled physical therapy to address her impairments to maximize her safety and functional mobility.  OBJECTIVE IMPAIRMENTS: Abnormal gait, decreased activity tolerance, decreased balance, decreased endurance, decreased mobility, difficulty walking, decreased ROM, decreased strength, hypomobility, postural dysfunction, and pain.   ACTIVITY LIMITATIONS: carrying, lifting, bending, standing, transfers, and locomotion level  PARTICIPATION LIMITATIONS: cleaning, laundry, shopping, and community activity  PERSONAL FACTORS: Past/current experiences, Time since onset of injury/illness/exacerbation, and 3+ comorbidities: Anxiety, OA, COPD, history of COVID-19, and history of depression   are also affecting patient's functional outcome.   REHAB POTENTIAL: Fair    CLINICAL DECISION MAKING: Evolving/moderate complexity  EVALUATION COMPLEXITY: Moderate   GOALS: Goals reviewed with patient? Yes  SHORT TERM GOALS: Target date: 08/27/24  Patient will be independent with her initial HEP. Baseline:  09/17/24:  HEP compliance 2 days/ week Goal status: Inprogress  2.  Patient will improve her modified ODI to 15/50 or better for improved perceived function with her daily activities. Baseline: 09/16/24: 17/50 Goal status: Inprogress  3.  Patient will be able to demonstrate improved lumbar extension to neutral for improved awareness of her surroundings. Baseline: 09/16/24:  progressing Goal status: Inprogress  4.  Patient will be able to complete her daily activities without her familiar pain exceeding 8/10. Baseline: 09/16/24:  Reports increased  pain in the morning, dependent upon pain medication for pain control, average pain scale 3-10/10 Goal status:  Inprogress  LONG TERM GOALS: Target date: 09/17/24  Patient will be independent with her advanced HEP. Baseline:  Goal status: INITIAL  2.  Patient will improve her timed up and go time to 12 seconds or less to reduce her risk of falling. Baseline:  Goal status: INITIAL  3.  Patient will improve her 2-minute walk test distance by at least 50 feet for improved community mobility. Baseline: 09/16/24:  Improved by 12 feet Goal status: Inprogress  4.  Patient will improve her 5 times sit to stand time to 12 seconds or less for improved lower extremity power. Baseline: 09/16/24: 5 STS no HHA 13.29 Goal status: Inprogress  5.  Patient will be able to complete her daily activities without her familiar pain exceeding 6/10. Baseline:  Goal status: INITIAL  PLAN:  PT FREQUENCY: 2x/week  PT DURATION: 6 weeks  PLANNED INTERVENTIONS: 97164- PT Re-evaluation, 97750- Physical Performance Testing, 97110-Therapeutic exercises, 97530- Therapeutic activity, 97112- Neuromuscular re-education, 97535- Self Care, 02859- Manual therapy, [phone]- Gait training, [phone]- Electrical stimulation (unattended), Patient/Family education, Balance training, Stair training, Taping, Joint mobilization, Spinal mobilization, Cryotherapy, and Moist heat.  PLAN FOR NEXT SESSION: Postural reeducation, lumbar and lower extremity strengthening, and balance interventions.  Follow up if using SPC.  Complete TUG and Thomas stretches next session.  Promote hip extension and postural strengthening.  Add bridges to HEP next session as well.  Plan to continues 2x/week for 4 more weeks   Lacinda Fass, PT, DPT  1:30 PM, 09/21/2024  "

## 2024-09-28 ENCOUNTER — Ambulatory Visit (HOSPITAL_COMMUNITY)

## 2024-10-01 ENCOUNTER — Ambulatory Visit (HOSPITAL_COMMUNITY)

## 2024-10-05 ENCOUNTER — Ambulatory Visit (HOSPITAL_COMMUNITY): Attending: Family Medicine

## 2024-10-05 ENCOUNTER — Telehealth (HOSPITAL_COMMUNITY): Payer: Self-pay

## 2024-10-05 ENCOUNTER — Encounter (HOSPITAL_COMMUNITY): Payer: Self-pay

## 2024-10-05 NOTE — Telephone Encounter (Signed)
 2nd no show, called concerning missed apt with no answer or voicemail available to leave message.   Augustin Mclean, LPTA/CLT; [facility] [phone]
